# Patient Record
Sex: Female | Born: 1935 | Race: White | Hispanic: No | Smoking: Never smoker
Health system: Southern US, Community
[De-identification: ages and names within clinical notes are randomized; demographics above are authoritative.]

## PROBLEM LIST (undated history)

## (undated) DIAGNOSIS — M1990 Unspecified osteoarthritis, unspecified site: Secondary | ICD-10-CM

## (undated) DIAGNOSIS — G309 Alzheimer's disease, unspecified: Secondary | ICD-10-CM

## (undated) DIAGNOSIS — Z6791 Unspecified blood type, Rh negative: Secondary | ICD-10-CM

## (undated) DIAGNOSIS — I10 Essential (primary) hypertension: Secondary | ICD-10-CM

## (undated) DIAGNOSIS — C649 Malignant neoplasm of unspecified kidney, except renal pelvis: Secondary | ICD-10-CM

## (undated) DIAGNOSIS — R7611 Nonspecific reaction to tuberculin skin test without active tuberculosis: Secondary | ICD-10-CM

## (undated) DIAGNOSIS — F0280 Dementia in other diseases classified elsewhere without behavioral disturbance: Secondary | ICD-10-CM

## (undated) DIAGNOSIS — Z9289 Personal history of other medical treatment: Secondary | ICD-10-CM

## (undated) DIAGNOSIS — T8859XA Other complications of anesthesia, initial encounter: Secondary | ICD-10-CM

## (undated) DIAGNOSIS — E785 Hyperlipidemia, unspecified: Secondary | ICD-10-CM

## (undated) DIAGNOSIS — K859 Acute pancreatitis without necrosis or infection, unspecified: Secondary | ICD-10-CM

## (undated) DIAGNOSIS — T4145XA Adverse effect of unspecified anesthetic, initial encounter: Secondary | ICD-10-CM

## (undated) HISTORY — PX: NEPHRECTOMY: SHX65

## (undated) HISTORY — PX: CATARACT EXTRACTION W/ INTRAOCULAR LENS IMPLANT: SHX1309

## (undated) HISTORY — PX: BREAST LUMPECTOMY: SHX2

## (undated) HISTORY — PX: TONSILLECTOMY: SUR1361

## (undated) HISTORY — PX: JOINT REPLACEMENT: SHX530

## (undated) HISTORY — PX: ABDOMINAL HYSTERECTOMY: SHX81

## (undated) HISTORY — PX: TOTAL HIP ARTHROPLASTY: SHX124

---

## 1959-05-29 HISTORY — PX: THYROID SURGERY: SHX805

## 1998-02-26 ENCOUNTER — Ambulatory Visit (HOSPITAL_COMMUNITY): Admission: RE | Admit: 1998-02-26 | Discharge: 1998-02-26 | Payer: Self-pay | Admitting: Obstetrics and Gynecology

## 1999-04-02 ENCOUNTER — Encounter: Payer: Self-pay | Admitting: Obstetrics and Gynecology

## 1999-04-02 ENCOUNTER — Ambulatory Visit (HOSPITAL_COMMUNITY): Admission: RE | Admit: 1999-04-02 | Discharge: 1999-04-02 | Payer: Self-pay | Admitting: Obstetrics and Gynecology

## 2002-09-27 DIAGNOSIS — Z9289 Personal history of other medical treatment: Secondary | ICD-10-CM

## 2002-09-27 HISTORY — DX: Personal history of other medical treatment: Z92.89

## 2014-07-22 ENCOUNTER — Emergency Department (HOSPITAL_BASED_OUTPATIENT_CLINIC_OR_DEPARTMENT_OTHER): Payer: Medicare Other

## 2014-07-22 ENCOUNTER — Emergency Department (HOSPITAL_BASED_OUTPATIENT_CLINIC_OR_DEPARTMENT_OTHER)
Admission: EM | Admit: 2014-07-22 | Discharge: 2014-07-23 | Disposition: A | Discharge status: Other Acute Inpatient Hospital | Payer: Medicare Other | Attending: Emergency Medicine | Admitting: Emergency Medicine

## 2014-07-22 ENCOUNTER — Encounter (HOSPITAL_BASED_OUTPATIENT_CLINIC_OR_DEPARTMENT_OTHER): Payer: Self-pay | Admitting: Emergency Medicine

## 2014-07-22 DIAGNOSIS — R748 Abnormal levels of other serum enzymes: Secondary | ICD-10-CM

## 2014-07-22 DIAGNOSIS — R531 Weakness: Secondary | ICD-10-CM | POA: Insufficient documentation

## 2014-07-22 DIAGNOSIS — R109 Unspecified abdominal pain: Secondary | ICD-10-CM | POA: Insufficient documentation

## 2014-07-22 DIAGNOSIS — I10 Essential (primary) hypertension: Secondary | ICD-10-CM | POA: Insufficient documentation

## 2014-07-22 DIAGNOSIS — R110 Nausea: Secondary | ICD-10-CM | POA: Diagnosis present

## 2014-07-22 DIAGNOSIS — O26899 Other specified pregnancy related conditions, unspecified trimester: Secondary | ICD-10-CM

## 2014-07-22 HISTORY — DX: Essential (primary) hypertension: I10

## 2014-07-22 HISTORY — DX: Unspecified osteoarthritis, unspecified site: M19.90

## 2014-07-22 LAB — CBC WITH DIFFERENTIAL/PLATELET
BASOS ABS: 0 10*3/uL (ref 0.0–0.1)
BASOS PCT: 1 % (ref 0–1)
EOS ABS: 0.1 10*3/uL (ref 0.0–0.7)
Eosinophils Relative: 1 % (ref 0–5)
HCT: 35.5 % — ABNORMAL LOW (ref 36.0–46.0)
HEMOGLOBIN: 11.7 g/dL — AB (ref 12.0–15.0)
Lymphocytes Relative: 16 % (ref 12–46)
Lymphs Abs: 1.3 10*3/uL (ref 0.7–4.0)
MCH: 32.7 pg (ref 26.0–34.0)
MCHC: 33 g/dL (ref 30.0–36.0)
MCV: 99.2 fL (ref 78.0–100.0)
MONOS PCT: 8 % (ref 3–12)
Monocytes Absolute: 0.6 10*3/uL (ref 0.1–1.0)
NEUTROS ABS: 6.2 10*3/uL (ref 1.7–7.7)
Neutrophils Relative %: 74 % (ref 43–77)
Platelets: 221 10*3/uL (ref 150–400)
RBC: 3.58 MIL/uL — ABNORMAL LOW (ref 3.87–5.11)
RDW: 13.1 % (ref 11.5–15.5)
WBC: 8.2 10*3/uL (ref 4.0–10.5)

## 2014-07-22 LAB — I-STAT CG4 LACTIC ACID, ED: Lactic Acid, Venous: 1.02 mmol/L (ref 0.5–2.2)

## 2014-07-22 LAB — COMPREHENSIVE METABOLIC PANEL
ALBUMIN: 3 g/dL — AB (ref 3.5–5.2)
ALT: 8 U/L (ref 0–35)
ANION GAP: 14 (ref 5–15)
AST: 14 U/L (ref 0–37)
Alkaline Phosphatase: 49 U/L (ref 39–117)
BUN: 30 mg/dL — ABNORMAL HIGH (ref 6–23)
CHLORIDE: 99 meq/L (ref 96–112)
CO2: 25 mEq/L (ref 19–32)
Calcium: 9.2 mg/dL (ref 8.4–10.5)
Creatinine, Ser: 1 mg/dL (ref 0.50–1.10)
GFR calc Af Amer: 58 mL/min — ABNORMAL LOW (ref >90)
GFR calc non Af Amer: 50 mL/min — ABNORMAL LOW (ref >90)
Glucose, Bld: 186 mg/dL — ABNORMAL HIGH (ref 70–99)
Potassium: 3.7 mEq/L (ref 3.7–5.3)
Sodium: 138 mEq/L (ref 137–147)
Total Bilirubin: 0.3 mg/dL (ref 0.3–1.2)
Total Protein: 6.4 g/dL (ref 6.0–8.3)

## 2014-07-22 LAB — TROPONIN I

## 2014-07-22 LAB — URINALYSIS, ROUTINE W REFLEX MICROSCOPIC
Bilirubin Urine: NEGATIVE
Glucose, UA: NEGATIVE mg/dL
Ketones, ur: 15 mg/dL — AB
Nitrite: NEGATIVE
Protein, ur: NEGATIVE mg/dL
SPECIFIC GRAVITY, URINE: 1.016 (ref 1.005–1.030)
UROBILINOGEN UA: 1 mg/dL (ref 0.0–1.0)
pH: 6.5 (ref 5.0–8.0)

## 2014-07-22 LAB — URINE MICROSCOPIC-ADD ON

## 2014-07-22 LAB — LIPASE, BLOOD: Lipase: 513 U/L — ABNORMAL HIGH (ref 11–59)

## 2014-07-22 MED ORDER — IOHEXOL 300 MG/ML  SOLN
25.0000 mL | Freq: Once | INTRAMUSCULAR | Status: AC | PRN
  Administered 2014-07-22: 25 mL via ORAL

## 2014-07-22 MED ORDER — IOHEXOL 300 MG/ML  SOLN
100.0000 mL | Freq: Once | INTRAMUSCULAR | Status: AC | PRN
  Administered 2014-07-22: 80 mL via INTRAVENOUS

## 2014-07-22 NOTE — ED Notes (Signed)
[facility] spoke with  Kaitlin Rodriguez, pt normally ambulates independently with no assitive device, pt mental status is worse at night per nursing home due to sundowners.

## 2014-07-22 NOTE — ED Notes (Signed)
Pt. Is here due to nausea that started after or before dinner tonight.  Pt. Is confused and unable to give full details of what is bothering her to full extent.

## 2014-07-22 NOTE — ED Provider Notes (Signed)
CSN: [id number]     Arrival date & time 07/22/14  1926 History   First MD Initiated Contact with Patient 07/22/14 1932     Chief Complaint  Patient presents with  . Nausea     (Consider location/radiation/quality/duration/timing/severity/associated sxs/prior Treatment) The history is provided by the patient and medical records. No language interpreter was used.    Kaitlin Rodriguez is a 80 y.o. female  with a hx of HTN, dementia, oesteoarthritis presents to the Emergency Department complaining of gradual, persistent, progressively worsening nausea and general weakness onset approx 1 hour PTA.  Pt comes in via EMS who states that she refused nausea medication with them.  RN spoke with facility which reports the patient lives independently and walks without assistance. She is able to perform her own ADLs.  Patient gives vague answers to review of system questions and is unable to clarify her general weakness. She reports she simply feels unwell.  Pt denies specific fever, headache, neck pain, chest pain, shortness of breath, abdominal pain, vomiting, diarrhea, dizziness, syncope, dysuria..     Past Medical History  Diagnosis Date  . Hypertension   . Dementia   . Osteoarthritis    History reviewed. No pertinent past surgical history. No family history on file. History  Substance Use Topics  . Smoking status: Never Smoker   . Smokeless tobacco: Not on file  . Alcohol Use: Not on file   OB History   Grav Para Term Preterm Abortions TAB SAB Ect Mult Living                 Review of Systems  Constitutional: Positive for chills. Negative for fever, diaphoresis, appetite change, fatigue and unexpected weight change.  HENT: Negative for mouth sores.   Eyes: Negative for visual disturbance.  Respiratory: Negative for cough, chest tightness, shortness of breath and wheezing.   Cardiovascular: Negative for chest pain.  Gastrointestinal: Positive for nausea. Negative for vomiting, abdominal  pain, diarrhea and constipation.  Endocrine: Negative for polydipsia, polyphagia and polyuria.  Genitourinary: Negative for dysuria, urgency, frequency and hematuria.  Musculoskeletal: Negative for back pain and neck stiffness.  Skin: Negative for rash.  Allergic/Immunologic: Negative for immunocompromised state.  Neurological: Positive for weakness. Negative for syncope, light-headedness and headaches.  Hematological: Does not bruise/bleed easily.  Psychiatric/Behavioral: Negative for sleep disturbance. The patient is not nervous/anxious.       Allergies  Oxybutynin and Sulfa antibiotics  Home Medications   Prior to Admission medications   Medication Sig Start Date End Date Taking? Authorizing Provider  atorvastatin (LIPITOR) 40 MG tablet Take 40 mg by mouth daily.   Yes Historical Provider, MD  donepezil (ARICEPT) 5 MG tablet Take 5 mg by mouth at bedtime.   Yes Historical Provider, MD  traMADol (ULTRAM) 50 MG tablet Take by mouth every 6 (six) hours as needed.   Yes Historical Provider, MD   BP 104/79  Pulse 80  Temp(Src) 97.5 F (36.4 C) (Rectal)  Resp 22  SpO2 98% Physical Exam  Nursing note and vitals reviewed. Constitutional: She appears well-developed and well-nourished. No distress.  Awake, alert, nontoxic appearance  HENT:  Head: Normocephalic and atraumatic.  Mouth/Throat: Oropharynx is clear and moist. No oropharyngeal exudate.  Eyes: Conjunctivae are normal. No scleral icterus.  Neck: Normal range of motion. Neck supple.  Cardiovascular: Normal rate, regular rhythm and intact distal pulses.   Pulmonary/Chest: Effort normal and breath sounds normal. No respiratory distress. She has no wheezes.  Equal chest expansion  Abdominal: Soft. Bowel sounds are normal. She exhibits no mass. There is no tenderness. There is no rebound and no guarding.  Soft and nontender Midline surgical incision, well-healed  Musculoskeletal: Normal range of motion. She exhibits no  edema.  Neurological: She is alert.  Speech is clear and goal oriented Moves extremities without ataxia  Skin: She is diaphoretic. There is pallor.  cool and slightly clammy  Psychiatric: She has a normal mood and affect.    ED Course  Procedures (including critical care time) Labs Review Labs Reviewed  CBC WITH DIFFERENTIAL - Abnormal; Notable for the following:    RBC 3.58 (*)    Hemoglobin 11.7 (*)    HCT 35.5 (*)    All other components within normal limits  COMPREHENSIVE METABOLIC PANEL - Abnormal; Notable for the following:    Glucose, Bld 186 (*)    BUN 30 (*)    Albumin 3.0 (*)    GFR calc non Af Amer 50 (*)    GFR calc Af Amer 58 (*)    All other components within normal limits  LIPASE, BLOOD - Abnormal; Notable for the following:    Lipase 513 (*)    All other components within normal limits  URINALYSIS, ROUTINE W REFLEX MICROSCOPIC - Abnormal; Notable for the following:    Hgb urine dipstick SMALL (*)    Ketones, ur 15 (*)    Leukocytes, UA SMALL (*)    All other components within normal limits  URINE MICROSCOPIC-ADD ON - Abnormal; Notable for the following:    Squamous Epithelial / LPF FEW (*)    All other components within normal limits  TROPONIN I  I-STAT CG4 LACTIC ACID, ED    Imaging Review Ct Abdomen Pelvis W Contrast  07/22/2014   CLINICAL DATA:  Nausea  EXAM: CT ABDOMEN AND PELVIS WITH CONTRAST  TECHNIQUE: Multidetector CT imaging of the abdomen and pelvis was performed using the standard protocol following bolus administration of intravenous contrast.  CONTRAST:  64mL OMNIPAQUE IOHEXOL 300 MG/ML SOLN, 44mL OMNIPAQUE IOHEXOL 300 MG/ML SOLN  COMPARISON:  11/08/2012  FINDINGS: Lung bases are predominantly clear. Heart size within normal range. Trace pericardial fluid versus thickening.  Mild intra and extrahepatic biliary ductal prominence, appears to taper toward the ampulla. Cholelithiasis. No pericholecystic fluid or fat stranding.  Right hepatic lobe  cysts, unchanged. No appreciable abnormality of the spleen, pancreas, adrenal glands, left kidney. Mild fullness of the left renal collecting system and ureter. The mid to distal left ureter shows urothelial hyperenhancement. The distal ureter is obscured by streak artifact from the patient's hip arthroplasties. Absent right kidney.  Large stool burden. Colonic diverticulosis. No definite evidence for diverticulitis or colitis. Normal appendix. Small bowel loops are of normal course and caliber. No free intraperitoneal air or fluid. No lymphadenopathy. Anterior abdominal wall laxity at the level of the umbilicus with small fat containing hernia.  Advanced atherosclerotic disease of the aorta and branch vessels without aneurysmal dilatation.  The bladder is obscured by streak artifact as are the adnexa. No appreciable abnormality of the uterus.  Multilevel degenerative change.  No acute osseous finding.  IMPRESSION: Mild fullness of the left renal collecting system and ureter however no delayed excretion. Mild urothelial hyperenhancement of the mid-distal left ureter, may reflect infectious or inflammatory change. Unfortunately, the distal left ureter and bladder are obscured by streak artifact from the patient's hip arthroplasties, limiting further evaluation. Correlate with urinalysis and ureteroscopy if warranted.  Mild biliary ductal prominence appears to  smoothly taper to the ampulla. Correlate with LFTs and ERCP if warranted. Cholelithiasis however no CT evidence for cholecystitis.  Colonic diverticulosis.   Electronically Signed   By: Carlos Levering M.D.   On: 07/22/2014 23:13   Dg Chest Port 1 View  07/22/2014   CLINICAL DATA:  Weakness.  Initial encounter  EXAM: PORTABLE CHEST - 1 VIEW  COMPARISON:  05/10/2014  FINDINGS: Normal heart size and mediastinal contours for age. No acute infiltrate or edema. No effusion or pneumothorax. No acute osseous findings.  IMPRESSION: No active disease.    Electronically Signed   By: Jorje Guild M.D.   On: 07/22/2014 21:03     EKG Interpretation   Date/Time:  Monday July 22 2014 19:50:09 EDT Ventricular Rate:  61 PR Interval:  184 QRS Duration: 84 QT Interval:  442 QTC Calculation: 444 R Axis:   31 Text Interpretation:  Sinus rhythm with marked sinus arrhythmia Otherwise  normal ECG Confirmed by HARRISON  MD, FORREST ([id number]) on 07/22/2014  11:45:02 PM       MDM   Final diagnoses:  Weakness  Abdominal pain complicating pregnancy  Elevated lipase  Abdominal pain complicating pregnancy  Elevated lipase   Kaitlin Rodriguez presents with nausea, diaphoresis and pallor. Patient reports "feeling ill."  Will begin workup including chest x-ray, UA,, , troponin and abdominal labs. EKG reassuring.  9:25 PM  Lipase at 513, otherwise reassuring including liver enzymes. Will obtain CT scan.   Diaphoresis has resolved.  11:30PM CT scan with mild biliary ductal prominence, old lithiasis but no CT evidence of cholecystitis. Patient remains with a negative Murphy sign and no abdominal distention, rebound or tenderness.  Admit for further monitoring and by mouth trials.   11:41 PM Discussed with Dr. Florene Glen at [facility] who will admit for further evaluation and monitoring.    BP 104/79  Pulse 80  Temp(Src) 97.5 F (36.4 C) (Rectal)  Resp 22  SpO2 98%   The patient was discussed with and seen by Dr. Aline Brochure who agrees with the treatment plan.   Jarrett Soho Perel Hauschild, PA-C 07/22/14 2345

## 2014-07-23 DIAGNOSIS — I10 Essential (primary) hypertension: Secondary | ICD-10-CM | POA: Diagnosis not present

## 2014-07-23 DIAGNOSIS — R531 Weakness: Secondary | ICD-10-CM | POA: Diagnosis not present

## 2014-07-23 DIAGNOSIS — R110 Nausea: Secondary | ICD-10-CM | POA: Diagnosis present

## 2014-07-23 NOTE — ED Provider Notes (Signed)
Medical screening examination/treatment/procedure(s) were conducted as a shared visit with non-physician practitioner(s) and myself.  I personally evaluated the patient during the encounter.   EKG Interpretation   Date/Time:  Monday July 22 2014 19:50:09 EDT Ventricular Rate:  61 PR Interval:  184 QRS Duration: 84 QT Interval:  442 QTC Calculation: 444 R Axis:   31 Text Interpretation:  Sinus rhythm with marked sinus arrhythmia Otherwise  normal ECG Confirmed by Ramia Sidney  MD, Haidy Kackley ([id number]) on 07/22/2014  11:45:02 PM      I interviewed and examined the patient. Lungs are CTAB. Cardiac exam wnl. Abdomen soft.  Elevated lipase. Plan for admission to hospitalist.   Pamella Pert, MD 07/23/14 [id number]

## 2014-08-27 ENCOUNTER — Emergency Department (HOSPITAL_BASED_OUTPATIENT_CLINIC_OR_DEPARTMENT_OTHER): Payer: Medicare Other

## 2014-08-27 ENCOUNTER — Emergency Department (HOSPITAL_BASED_OUTPATIENT_CLINIC_OR_DEPARTMENT_OTHER)
Admission: EM | Admit: 2014-08-27 | Discharge: 2014-08-27 | Disposition: A | Discharge status: Home / Self Care | Payer: Medicare Other | Attending: Emergency Medicine | Admitting: Emergency Medicine

## 2014-08-27 ENCOUNTER — Encounter (HOSPITAL_BASED_OUTPATIENT_CLINIC_OR_DEPARTMENT_OTHER): Payer: Self-pay | Admitting: *Deleted

## 2014-08-27 DIAGNOSIS — M1990 Unspecified osteoarthritis, unspecified site: Secondary | ICD-10-CM | POA: Diagnosis not present

## 2014-08-27 DIAGNOSIS — W1839XA Other fall on same level, initial encounter: Secondary | ICD-10-CM | POA: Insufficient documentation

## 2014-08-27 DIAGNOSIS — Z79899 Other long term (current) drug therapy: Secondary | ICD-10-CM | POA: Diagnosis not present

## 2014-08-27 DIAGNOSIS — Y9289 Other specified places as the place of occurrence of the external cause: Secondary | ICD-10-CM | POA: Diagnosis not present

## 2014-08-27 DIAGNOSIS — Y998 Other external cause status: Secondary | ICD-10-CM | POA: Diagnosis not present

## 2014-08-27 DIAGNOSIS — W19XXXA Unspecified fall, initial encounter: Secondary | ICD-10-CM

## 2014-08-27 DIAGNOSIS — F0390 Unspecified dementia without behavioral disturbance: Secondary | ICD-10-CM | POA: Insufficient documentation

## 2014-08-27 DIAGNOSIS — Z043 Encounter for examination and observation following other accident: Secondary | ICD-10-CM | POA: Diagnosis present

## 2014-08-27 DIAGNOSIS — Y9389 Activity, other specified: Secondary | ICD-10-CM | POA: Diagnosis not present

## 2014-08-27 DIAGNOSIS — Z792 Long term (current) use of antibiotics: Secondary | ICD-10-CM | POA: Diagnosis not present

## 2014-08-27 DIAGNOSIS — I10 Essential (primary) hypertension: Secondary | ICD-10-CM | POA: Diagnosis not present

## 2014-08-27 LAB — URINALYSIS, ROUTINE W REFLEX MICROSCOPIC
BILIRUBIN URINE: NEGATIVE
Glucose, UA: NEGATIVE mg/dL
Ketones, ur: NEGATIVE mg/dL
Nitrite: NEGATIVE
PH: 6 (ref 5.0–8.0)
Protein, ur: 30 mg/dL — AB
SPECIFIC GRAVITY, URINE: 1.025 (ref 1.005–1.030)
Urobilinogen, UA: 1 mg/dL (ref 0.0–1.0)

## 2014-08-27 LAB — CBC WITH DIFFERENTIAL/PLATELET
Basophils Absolute: 0.1 10*3/uL (ref 0.0–0.1)
Basophils Relative: 1 % (ref 0–1)
EOS PCT: 2 % (ref 0–5)
Eosinophils Absolute: 0.2 10*3/uL (ref 0.0–0.7)
HEMATOCRIT: 36.2 % (ref 36.0–46.0)
HEMOGLOBIN: 12 g/dL (ref 12.0–15.0)
Lymphocytes Relative: 38 % (ref 12–46)
Lymphs Abs: 3.1 10*3/uL (ref 0.7–4.0)
MCH: 32.8 pg (ref 26.0–34.0)
MCHC: 33.1 g/dL (ref 30.0–36.0)
MCV: 98.9 fL (ref 78.0–100.0)
MONOS PCT: 9 % (ref 3–12)
Monocytes Absolute: 0.8 10*3/uL (ref 0.1–1.0)
NEUTROS ABS: 4.2 10*3/uL (ref 1.7–7.7)
Neutrophils Relative %: 50 % (ref 43–77)
Platelets: 406 10*3/uL — ABNORMAL HIGH (ref 150–400)
RBC: 3.66 MIL/uL — ABNORMAL LOW (ref 3.87–5.11)
RDW: 13.4 % (ref 11.5–15.5)
WBC: 8.3 10*3/uL (ref 4.0–10.5)

## 2014-08-27 LAB — URINE MICROSCOPIC-ADD ON

## 2014-08-27 LAB — BASIC METABOLIC PANEL
Anion gap: 16 — ABNORMAL HIGH (ref 5–15)
BUN: 32 mg/dL — AB (ref 6–23)
CALCIUM: 9.5 mg/dL (ref 8.4–10.5)
CHLORIDE: 101 meq/L (ref 96–112)
CO2: 24 meq/L (ref 19–32)
Creatinine, Ser: 1 mg/dL (ref 0.50–1.10)
GFR calc Af Amer: 58 mL/min — ABNORMAL LOW (ref >90)
GFR calc non Af Amer: 50 mL/min — ABNORMAL LOW (ref >90)
Glucose, Bld: 130 mg/dL — ABNORMAL HIGH (ref 70–99)
Potassium: 4.3 mEq/L (ref 3.7–5.3)
Sodium: 141 mEq/L (ref 137–147)

## 2014-08-27 MED ORDER — CEFPODOXIME PROXETIL 100 MG PO TABS: 100.0000 mg | ORAL_TABLET | Freq: Two times a day (BID) | ORAL | Status: DC

## 2014-08-27 NOTE — Discharge Instructions (Signed)
Fall Prevention and Home Safety  Falls cause injuries and can affect all age groups. It is possible to use preventive measures to significantly decrease the likelihood of falls. There are many simple measures which can make your home safer and prevent falls.  OUTDOORS   Repair cracks and edges of walkways and driveways.   Remove high doorway thresholds.   Trim shrubbery on the main path into your home.   Have good outside lighting.   Clear walkways of tools, rocks, debris, and clutter.   Check that handrails are not broken and are securely fastened. Both sides of steps should have handrails.   Have leaves, snow, and ice cleared regularly.   Use sand or salt on walkways during winter months.   In the garage, clean up grease or oil spills.  BATHROOM   Install night lights.   Install grab bars by the toilet and in the tub and shower.   Use non-skid mats or decals in the tub or shower.   Place a plastic non-slip stool in the shower to sit on, if needed.   Keep floors dry and clean up all water on the floor immediately.   Remove soap buildup in the tub or shower on a regular basis.   Secure bath mats with non-slip, double-sided rug tape.   Remove throw rugs and tripping hazards from the floors.  BEDROOMS   Install night lights.   Make sure a bedside light is easy to reach.   Do not use oversized bedding.   Keep a telephone by your bedside.   Have a firm chair with side arms to use for getting dressed.   Remove throw rugs and tripping hazards from the floor.  KITCHEN   Keep handles on pots and pans turned toward the center of the stove. Use back burners when possible.   Clean up spills quickly and allow time for drying.   Avoid walking on wet floors.   Avoid hot utensils and knives.   Position shelves so they are not too high or low.   Place commonly used objects within easy reach.   If necessary, use a sturdy step stool with a grab bar when reaching.   Keep electrical cables out of the  way.   Do not use floor polish or wax that makes floors slippery. If you must use wax, use non-skid floor wax.   Remove throw rugs and tripping hazards from the floor.  STAIRWAYS   Never leave objects on stairs.   Place handrails on both sides of stairways and use them. Fix any loose handrails. Make sure handrails on both sides of the stairways are as long as the stairs.   Check carpeting to make sure it is firmly attached along stairs. Make repairs to worn or loose carpet promptly.   Avoid placing throw rugs at the top or bottom of stairways, or properly secure the rug with carpet tape to prevent slippage. Get rid of throw rugs, if possible.   Have an electrician put in a light switch at the top and bottom of the stairs.  OTHER FALL PREVENTION TIPS   Wear low-heel or rubber-soled shoes that are supportive and fit well. Wear closed toe shoes.   When using a stepladder, make sure it is fully opened and both spreaders are firmly locked. Do not climb a closed stepladder.   Add color or contrast paint or tape to grab bars and handrails in your home. Place contrasting color strips on first and last   steps.   Learn and use mobility aids as needed. Install an electrical emergency response system.   Turn on lights to avoid dark areas. Replace light bulbs that burn out immediately. Get light switches that glow.   Arrange furniture to create clear pathways. Keep furniture in the same place.   Firmly attach carpet with non-skid or double-sided tape.   Eliminate uneven floor surfaces.   Select a carpet pattern that does not visually hide the edge of steps.   Be aware of all pets.  OTHER HOME SAFETY TIPS   Set the water temperature for 120 F (48.8 C).   Keep emergency numbers on or near the telephone.   Keep smoke detectors on every level of the home and near sleeping areas.  Document Released: 09/03/2002 Document Revised: 03/14/2012 Document Reviewed: 12/03/2011  Exit[employer] Patient Information 2015  [employer]. This information is not intended to replace advice given to you by your health care provider. Make sure you discuss any questions you have with your health care provider.    Urinary Tract Infection  Urinary tract infections (UTIs) can develop anywhere along your urinary tract. Your urinary tract is your body's drainage system for removing wastes and extra water. Your urinary tract includes two kidneys, two ureters, a bladder, and a urethra. Your kidneys are a pair of bean-shaped organs. Each kidney is about the size of your fist. They are located below your ribs, one on each side of your spine.  CAUSES  Infections are caused by microbes, which are microscopic organisms, including fungi, viruses, and bacteria. These organisms are so small that they can only be seen through a microscope. Bacteria are the microbes that most commonly cause UTIs.  SYMPTOMS   Symptoms of UTIs may vary by age and gender of the patient and by the location of the infection. Symptoms in young women typically include a frequent and intense urge to urinate and a painful, burning feeling in the bladder or urethra during urination. Older women and men are more likely to be tired, shaky, and weak and have muscle aches and abdominal pain. A fever may mean the infection is in your kidneys. Other symptoms of a kidney infection include pain in your back or sides below the ribs, nausea, and vomiting.  DIAGNOSIS  To diagnose a UTI, your caregiver will ask you about your symptoms. Your caregiver also will ask to provide a urine sample. The urine sample will be tested for bacteria and white blood cells. White blood cells are made by your body to help fight infection.  TREATMENT   Typically, UTIs can be treated with medication. Because most UTIs are caused by a bacterial infection, they usually can be treated with the use of antibiotics. The choice of antibiotic and length of treatment depend on your symptoms and the type of bacteria causing  your infection.  HOME CARE INSTRUCTIONS   If you were prescribed antibiotics, take them exactly as your caregiver instructs you. Finish the medication even if you feel better after you have only taken some of the medication.   Drink enough water and fluids to keep your urine clear or pale yellow.   Avoid caffeine, tea, and carbonated beverages. They tend to irritate your bladder.   Empty your bladder often. Avoid holding urine for long periods of time.   Empty your bladder before and after sexual intercourse.   After a bowel movement, women should cleanse from front to back. Use each tissue only once.  SEEK MEDICAL CARE   IF:    You have back pain.   You develop a fever.   Your symptoms do not begin to resolve within 3 days.  SEEK IMMEDIATE MEDICAL CARE IF:    You have severe back pain or lower abdominal pain.   You develop chills.   You have nausea or vomiting.   You have continued burning or discomfort with urination.  MAKE SURE YOU:    Understand these instructions.   Will watch your condition.   Will get help right away if you are not doing well or get worse.  Document Released: 06/23/2005 Document Revised: 03/14/2012 Document Reviewed: 10/22/2011  [employer] Patient Information 2015 [employer]. This information is not intended to replace advice given to you by your health care provider. Make sure you discuss any questions you have with your health care provider.

## 2014-08-27 NOTE — ED Notes (Signed)
Bright in by EMS for eval from Fall  No complaints

## 2014-08-27 NOTE — ED Notes (Signed)
[facility] called for transport to [facility]

## 2014-08-27 NOTE — ED Provider Notes (Signed)
CSN: [id number]     Arrival date & time 08/27/14  1855 History   First MD Initiated Contact with Patient 08/27/14 1905     Chief Complaint  Patient presents with  . Fall     (Consider location/radiation/quality/duration/timing/severity/associated sxs/prior Treatment) Patient is a 80 y.o. female presenting with fall.  Fall This is a recurrent problem. The current episode started 1 to 2 hours ago. The problem occurs constantly. The problem has not changed since onset.Pertinent negatives include no chest pain, no abdominal pain, no headaches and no shortness of breath. Nothing aggravates the symptoms. Nothing relieves the symptoms.    Past Medical History  Diagnosis Date  . Hypertension   . Dementia   . Osteoarthritis    History reviewed. No pertinent past surgical history. History reviewed. No pertinent family history. History  Substance Use Topics  . Smoking status: Never Smoker   . Smokeless tobacco: Not on file  . Alcohol Use: No   OB History    No data available     Review of Systems  Constitutional: Negative for fever and chills.  HENT: Negative for congestion, rhinorrhea and sore throat.   Eyes: Negative for photophobia and visual disturbance.  Respiratory: Negative for cough and shortness of breath.   Cardiovascular: Negative for chest pain and leg swelling.  Gastrointestinal: Negative for nausea, vomiting, abdominal pain, diarrhea and constipation.  Endocrine: Negative for polyphagia and polyuria.  Genitourinary: Negative for dysuria, flank pain, vaginal bleeding, vaginal discharge and enuresis.  Musculoskeletal: Negative for back pain and gait problem.  Skin: Negative for color change and rash.  Neurological: Negative for dizziness, syncope, light-headedness, numbness and headaches.  Hematological: Negative for adenopathy. Does not bruise/bleed easily.  All other systems reviewed and are negative.     Allergies  Oxybutynin and Sulfa antibiotics  Home  Medications   Prior to Admission medications   Medication Sig Start Date End Date Taking? Authorizing Provider  atorvastatin (LIPITOR) 40 MG tablet Take 40 mg by mouth daily.    Historical Provider, MD  cefpodoxime (VANTIN) 100 MG tablet Take 1 tablet (100 mg total) by mouth 2 (two) times daily. 08/27/14   Debby Freiberg, MD  donepezil (ARICEPT) 5 MG tablet Take 5 mg by mouth at bedtime.    Historical Provider, MD  traMADol (ULTRAM) 50 MG tablet Take by mouth every 6 (six) hours as needed.    Historical Provider, MD   BP 100/60 mmHg  Pulse 72  Temp(Src) 98.7 F (37.1 C) (Oral)  Resp 18  SpO2 98% Physical Exam  Constitutional: She is oriented to person, place, and time. She appears well-developed and well-nourished.  HENT:  Head: Normocephalic and atraumatic.  Right Ear: External ear normal.  Left Ear: External ear normal.  Eyes: Conjunctivae and EOM are normal. Pupils are equal, round, and reactive to light.  Neck: Normal range of motion. Neck supple.  Cardiovascular: Normal rate, regular rhythm, normal heart sounds and intact distal pulses.   Pulmonary/Chest: Effort normal and breath sounds normal.  Abdominal: Soft. Bowel sounds are normal. There is no tenderness.  Musculoskeletal: Normal range of motion.  Neurological: She is alert and oriented to person, place, and time.  Skin: Skin is warm and dry.  Vitals reviewed.   ED Course  Procedures (including critical care time) Labs Review Labs Reviewed  CBC WITH DIFFERENTIAL - Abnormal; Notable for the following:    RBC 3.66 (*)    Platelets 406 (*)    All other components within normal limits  BASIC METABOLIC PANEL - Abnormal; Notable for the following:    Glucose, Bld 130 (*)    BUN 32 (*)    GFR calc non Af Amer 50 (*)    GFR calc Af Amer 58 (*)    Anion gap 16 (*)    All other components within normal limits  URINALYSIS, ROUTINE W REFLEX MICROSCOPIC - Abnormal; Notable for the following:    APPearance CLOUDY (*)     Hgb urine dipstick MODERATE (*)    Protein, ur 30 (*)    Leukocytes, UA MODERATE (*)    All other components within normal limits  URINE MICROSCOPIC-ADD ON - Abnormal; Notable for the following:    Bacteria, UA MANY (*)    Crystals CA OXALATE CRYSTALS (*)    All other components within normal limits    Imaging Review Dg Chest 2 View  08/27/2014   CLINICAL DATA:  80 year old female with dementia status post fall  EXAM: CHEST  2 VIEW  COMPARISON:  Prior chest x-ray 07/22/2014  FINDINGS: Cardiac and mediastinal contours remain within normal limits. Atherosclerotic calcifications present in the transverse aorta. No pneumothorax or pleural effusion. Stable bronchitic changes. No evidence of edema or focal airspace consolidation. The No acute osseous abnormality.  IMPRESSION: No active cardiopulmonary disease.   Electronically Signed   By: Jacqulynn Cadet M.D.   On: 08/27/2014 19:45   Dg Shoulder Right  08/27/2014   CLINICAL DATA:  80 year old female with fall, right shoulder injury and pain. Initial encounter.  EXAM: RIGHT SHOULDER - 2+ VIEW  COMPARISON:  Prior chest radiographs  FINDINGS: No acute fracture, subluxation or dislocation identified.  The visualized left bony thorax is unremarkable.  No focal bony lesions are present.  IMPRESSION: No acute abnormality.   Electronically Signed   By: Hassan Rowan M.D.   On: 08/27/2014 20:11   Ct Head Wo Contrast  08/27/2014   CLINICAL DATA:  Fall.  Dementia.  EXAM: CT HEAD WITHOUT CONTRAST  TECHNIQUE: Contiguous axial images were obtained from the base of the skull through the vertex without intravenous contrast.  COMPARISON:  04/14/2006.  FINDINGS: No mass lesion, mass effect, midline shift, hydrocephalus, hemorrhage. No acute territorial cortical ischemia/infarct. Atrophy and chronic ischemic white matter disease is present. Motion artifact was present on the initial scan. The slices were repeated with improvement and the study is diagnostic. Small lacunar  infarct is present in the LEFT cerebellum which is chronic.  IMPRESSION: Atrophy and chronic ischemic white matter disease without acute intracranial abnormality. Old cerebellar infarcts.   Electronically Signed   By: Dereck Ligas M.D.   On: 08/27/2014 19:58     EKG Interpretation   Date/Time:  Tuesday August 27 2014 19:12:59 EST Ventricular Rate:  60 PR Interval:  182 QRS Duration: 82 QT Interval:  420 QTC Calculation: 420 R Axis:   46 Text Interpretation:  Normal sinus rhythm Normal ECG No significant change  since last tracing Confirmed by Debby Freiberg [phone]) on 08/27/2014  7:41:36 PM      MDM   Final diagnoses:  Fall    80 y.o. female with pertinent PMH of dementia presents from nursing facility after a mechanical fall. Patient is a poor historian. No reported chest pain, syncope. The patient was getting out of her bed, slipped, and was able to lower herself to the ground. She states that she did not hit her head. On arrival to the patient has vital signs and physical exam as above. the trauma. she  has a very mild amount of tenderness in the right shoulder. images obtained as above and unremarkable for acute pathology. she also signs of urinary tract infection. discharged home in stable condition.    1. Massie Kluver, MD 08/27/14 2113

## 2014-08-27 NOTE — ED Notes (Signed)
MD at bedside. 

## 2014-12-04 ENCOUNTER — Inpatient Hospital Stay (HOSPITAL_BASED_OUTPATIENT_CLINIC_OR_DEPARTMENT_OTHER)
Admission: EM | Admit: 2014-12-04 | Discharge: 2014-12-08 | DRG: 536 | Disposition: A | Discharge status: Skilled Nursing Facility | Payer: Medicare Other | Attending: Internal Medicine | Admitting: Internal Medicine

## 2014-12-04 ENCOUNTER — Other Ambulatory Visit (HOSPITAL_BASED_OUTPATIENT_CLINIC_OR_DEPARTMENT_OTHER): Payer: Self-pay

## 2014-12-04 ENCOUNTER — Encounter (HOSPITAL_BASED_OUTPATIENT_CLINIC_OR_DEPARTMENT_OTHER): Payer: Self-pay | Admitting: *Deleted

## 2014-12-04 ENCOUNTER — Emergency Department (HOSPITAL_BASED_OUTPATIENT_CLINIC_OR_DEPARTMENT_OTHER): Payer: Medicare Other

## 2014-12-04 DIAGNOSIS — G309 Alzheimer's disease, unspecified: Secondary | ICD-10-CM

## 2014-12-04 DIAGNOSIS — Z961 Presence of intraocular lens: Secondary | ICD-10-CM | POA: Diagnosis present

## 2014-12-04 DIAGNOSIS — S72111A Displaced fracture of greater trochanter of right femur, initial encounter for closed fracture: Secondary | ICD-10-CM | POA: Diagnosis present

## 2014-12-04 DIAGNOSIS — N390 Urinary tract infection, site not specified: Secondary | ICD-10-CM

## 2014-12-04 DIAGNOSIS — I10 Essential (primary) hypertension: Secondary | ICD-10-CM | POA: Diagnosis present

## 2014-12-04 DIAGNOSIS — X58XXXA Exposure to other specified factors, initial encounter: Secondary | ICD-10-CM | POA: Diagnosis present

## 2014-12-04 DIAGNOSIS — Z96643 Presence of artificial hip joint, bilateral: Secondary | ICD-10-CM | POA: Diagnosis present

## 2014-12-04 DIAGNOSIS — M25551 Pain in right hip: Secondary | ICD-10-CM

## 2014-12-04 DIAGNOSIS — Z66 Do not resuscitate: Secondary | ICD-10-CM | POA: Diagnosis present

## 2014-12-04 DIAGNOSIS — R319 Hematuria, unspecified: Secondary | ICD-10-CM | POA: Diagnosis present

## 2014-12-04 DIAGNOSIS — Z85528 Personal history of other malignant neoplasm of kidney: Secondary | ICD-10-CM

## 2014-12-04 DIAGNOSIS — F0280 Dementia in other diseases classified elsewhere without behavioral disturbance: Secondary | ICD-10-CM | POA: Diagnosis present

## 2014-12-04 DIAGNOSIS — E860 Dehydration: Secondary | ICD-10-CM

## 2014-12-04 DIAGNOSIS — Y92129 Unspecified place in nursing home as the place of occurrence of the external cause: Secondary | ICD-10-CM | POA: Diagnosis not present

## 2014-12-04 DIAGNOSIS — S72001D Fracture of unspecified part of neck of right femur, subsequent encounter for closed fracture with routine healing: Secondary | ICD-10-CM | POA: Diagnosis not present

## 2014-12-04 DIAGNOSIS — M75101 Unspecified rotator cuff tear or rupture of right shoulder, not specified as traumatic: Secondary | ICD-10-CM | POA: Diagnosis present

## 2014-12-04 DIAGNOSIS — Z79899 Other long term (current) drug therapy: Secondary | ICD-10-CM | POA: Diagnosis not present

## 2014-12-04 DIAGNOSIS — S72001A Fracture of unspecified part of neck of right femur, initial encounter for closed fracture: Secondary | ICD-10-CM | POA: Diagnosis not present

## 2014-12-04 DIAGNOSIS — M1990 Unspecified osteoarthritis, unspecified site: Secondary | ICD-10-CM | POA: Diagnosis present

## 2014-12-04 DIAGNOSIS — E785 Hyperlipidemia, unspecified: Secondary | ICD-10-CM | POA: Diagnosis present

## 2014-12-04 DIAGNOSIS — Z9849 Cataract extraction status, unspecified eye: Secondary | ICD-10-CM

## 2014-12-04 DIAGNOSIS — R52 Pain, unspecified: Secondary | ICD-10-CM

## 2014-12-04 HISTORY — DX: Acute pancreatitis without necrosis or infection, unspecified: K85.90

## 2014-12-04 HISTORY — DX: Other complications of anesthesia, initial encounter: T88.59XA

## 2014-12-04 HISTORY — DX: Nonspecific reaction to tuberculin skin test without active tuberculosis: R76.11

## 2014-12-04 HISTORY — DX: Unspecified blood type, rh negative: Z67.91

## 2014-12-04 HISTORY — DX: Dementia in other diseases classified elsewhere, unspecified severity, without behavioral disturbance, psychotic disturbance, mood disturbance, and anxiety: F02.80

## 2014-12-04 HISTORY — DX: Malignant neoplasm of unspecified kidney, except renal pelvis: C64.9

## 2014-12-04 HISTORY — DX: Adverse effect of unspecified anesthetic, initial encounter: T41.45XA

## 2014-12-04 HISTORY — DX: Personal history of other medical treatment: Z92.89

## 2014-12-04 HISTORY — DX: Alzheimer's disease, unspecified: G30.9

## 2014-12-04 HISTORY — DX: Hyperlipidemia, unspecified: E78.5

## 2014-12-04 LAB — COMPREHENSIVE METABOLIC PANEL
ALT: 12 U/L (ref 0–35)
ANION GAP: 2 — AB (ref 5–15)
AST: 25 U/L (ref 0–37)
Albumin: 3.2 g/dL — ABNORMAL LOW (ref 3.5–5.2)
Alkaline Phosphatase: 46 U/L (ref 39–117)
BILIRUBIN TOTAL: 0.5 mg/dL (ref 0.3–1.2)
BUN: 35 mg/dL — AB (ref 6–23)
CALCIUM: 8.5 mg/dL (ref 8.4–10.5)
CHLORIDE: 109 mmol/L (ref 96–112)
CO2: 24 mmol/L (ref 19–32)
CREATININE: 1.11 mg/dL — AB (ref 0.50–1.10)
GFR, EST AFRICAN AMERICAN: 51 mL/min — AB (ref >90)
GFR, EST NON AFRICAN AMERICAN: 44 mL/min — AB (ref >90)
Glucose, Bld: 116 mg/dL — ABNORMAL HIGH (ref 70–99)
POTASSIUM: 3.5 mmol/L (ref 3.5–5.1)
Sodium: 135 mmol/L (ref 135–145)
TOTAL PROTEIN: 6.6 g/dL (ref 6.0–8.3)

## 2014-12-04 LAB — CBC
HCT: 35.7 % — ABNORMAL LOW (ref 36.0–46.0)
Hemoglobin: 11.8 g/dL — ABNORMAL LOW (ref 12.0–15.0)
MCH: 30.9 pg (ref 26.0–34.0)
MCHC: 33.1 g/dL (ref 30.0–36.0)
MCV: 93.5 fL (ref 78.0–100.0)
PLATELETS: 357 10*3/uL (ref 150–400)
RBC: 3.82 MIL/uL — AB (ref 3.87–5.11)
RDW: 14.8 % (ref 11.5–15.5)
WBC: 10.9 10*3/uL — ABNORMAL HIGH (ref 4.0–10.5)

## 2014-12-04 LAB — URINALYSIS, ROUTINE W REFLEX MICROSCOPIC
Glucose, UA: NEGATIVE mg/dL
Ketones, ur: 15 mg/dL — AB
NITRITE: NEGATIVE
Protein, ur: 100 mg/dL — AB
Specific Gravity, Urine: 1.027 (ref 1.005–1.030)
Urobilinogen, UA: 1 mg/dL (ref 0.0–1.0)
pH: 8 (ref 5.0–8.0)

## 2014-12-04 LAB — CBC WITH DIFFERENTIAL/PLATELET
BASOS ABS: 0.1 10*3/uL (ref 0.0–0.1)
Basophils Relative: 1 % (ref 0–1)
Eosinophils Absolute: 0 10*3/uL (ref 0.0–0.7)
Eosinophils Relative: 0 % (ref 0–5)
HEMATOCRIT: 34.9 % — AB (ref 36.0–46.0)
Hemoglobin: 11.7 g/dL — ABNORMAL LOW (ref 12.0–15.0)
LYMPHS ABS: 1.4 10*3/uL (ref 0.7–4.0)
LYMPHS PCT: 13 % (ref 12–46)
MCH: 31.7 pg (ref 26.0–34.0)
MCHC: 33.5 g/dL (ref 30.0–36.0)
MCV: 94.6 fL (ref 78.0–100.0)
Monocytes Absolute: 0.7 10*3/uL (ref 0.1–1.0)
Monocytes Relative: 7 % (ref 3–12)
NEUTROS ABS: 8.5 10*3/uL — AB (ref 1.7–7.7)
NEUTROS PCT: 79 % — AB (ref 43–77)
PLATELETS: 353 10*3/uL (ref 150–400)
RBC: 3.69 MIL/uL — AB (ref 3.87–5.11)
RDW: 14.3 % (ref 11.5–15.5)
WBC: 10.6 10*3/uL — AB (ref 4.0–10.5)

## 2014-12-04 LAB — URINE MICROSCOPIC-ADD ON

## 2014-12-04 LAB — PROTIME-INR
INR: 1.08 (ref 0.00–1.49)
PROTHROMBIN TIME: 14 s (ref 11.6–15.2)

## 2014-12-04 LAB — TSH: TSH: 2.611 u[IU]/mL (ref 0.350–4.500)

## 2014-12-04 LAB — TROPONIN I: Troponin I: <0.03 ng/mL (ref <0.031)

## 2014-12-04 LAB — CREATININE, SERUM
Creatinine, Ser: 1.3 mg/dL — ABNORMAL HIGH (ref 0.50–1.10)
GFR calc Af Amer: 42 mL/min — ABNORMAL LOW (ref >90)
GFR, EST NON AFRICAN AMERICAN: 36 mL/min — AB (ref >90)

## 2014-12-04 MED ORDER — FENTANYL CITRATE 0.05 MG/ML IJ SOLN
50.0000 ug | Freq: Once | INTRAMUSCULAR | Status: AC
  Administered 2014-12-04: 50 ug via INTRAVENOUS
  Filled 2014-12-04: qty 2

## 2014-12-04 MED ORDER — DONEPEZIL HCL 5 MG PO TABS
5.0000 mg | ORAL_TABLET | Freq: Every day | ORAL | Status: DC
  Administered 2014-12-04 – 2014-12-07 (×4): 5 mg via ORAL
  Filled 2014-12-04 (×4): qty 1

## 2014-12-04 MED ORDER — ONDANSETRON HCL 4 MG/2ML IJ SOLN: 4.0000 mg | Freq: Four times a day (QID) | INTRAMUSCULAR | Status: DC | PRN

## 2014-12-04 MED ORDER — ATORVASTATIN CALCIUM 40 MG PO TABS
40.0000 mg | ORAL_TABLET | Freq: Every day | ORAL | Status: DC
  Administered 2014-12-05 – 2014-12-08 (×4): 40 mg via ORAL
  Filled 2014-12-04 (×4): qty 1

## 2014-12-04 MED ORDER — CEFTRIAXONE SODIUM IN DEXTROSE 20 MG/ML IV SOLN
1.0000 g | INTRAVENOUS | Status: DC
  Administered 2014-12-05 – 2014-12-07 (×3): 1 g via INTRAVENOUS
  Filled 2014-12-04 (×3): qty 50

## 2014-12-04 MED ORDER — TRAMADOL HCL 50 MG PO TABS
50.0000 mg | ORAL_TABLET | Freq: Once | ORAL | Status: AC
Start: 2014-12-04 — End: 2014-12-04
  Administered 2014-12-04: 50 mg via ORAL
  Filled 2014-12-04: qty 1

## 2014-12-04 MED ORDER — FENTANYL CITRATE 0.05 MG/ML IJ SOLN
12.5000 ug | INTRAMUSCULAR | Status: DC | PRN
  Administered 2014-12-04 (×2): 12.5 ug via INTRAVENOUS
  Filled 2014-12-04 (×2): qty 2

## 2014-12-04 MED ORDER — CEFTRIAXONE SODIUM 1 G IJ SOLR
INTRAMUSCULAR | Status: AC
  Filled 2014-12-04: qty 10

## 2014-12-04 MED ORDER — ACETAMINOPHEN 325 MG PO TABS: 650.0000 mg | ORAL_TABLET | ORAL | Status: DC | PRN

## 2014-12-04 MED ORDER — HALOPERIDOL 1 MG PO TABS
1.0000 mg | ORAL_TABLET | Freq: Four times a day (QID) | ORAL | Status: DC | PRN
Start: 2014-12-04 — End: 2014-12-08
  Filled 2014-12-04: qty 1

## 2014-12-04 MED ORDER — ALUM & MAG HYDROXIDE-SIMETH 200-200-20 MG/5ML PO SUSP: 30.0000 mL | Freq: Four times a day (QID) | ORAL | Status: DC | PRN

## 2014-12-04 MED ORDER — DEXTROSE 5 % IV SOLN
1.0000 g | Freq: Once | INTRAVENOUS | Status: AC
  Administered 2014-12-04: 1 g via INTRAVENOUS

## 2014-12-04 MED ORDER — POLYETHYLENE GLYCOL 3350 17 G PO PACK
17.0000 g | PACK | Freq: Every day | ORAL | Status: DC
  Administered 2014-12-05 – 2014-12-07 (×3): 17 g via ORAL
  Filled 2014-12-04 (×4): qty 1

## 2014-12-04 MED ORDER — HYDROCODONE-ACETAMINOPHEN 5-325 MG PO TABS
1.0000 | ORAL_TABLET | ORAL | Status: DC | PRN
  Administered 2014-12-04: 2 via ORAL
  Administered 2014-12-05 (×3): 1 via ORAL
  Administered 2014-12-05: 2 via ORAL
  Administered 2014-12-05: 1 via ORAL
  Administered 2014-12-06 – 2014-12-08 (×12): 2 via ORAL
  Filled 2014-12-04 (×4): qty 2
  Filled 2014-12-04: qty 1
  Filled 2014-12-04 (×5): qty 2
  Filled 2014-12-04 (×2): qty 1
  Filled 2014-12-04 (×4): qty 2
  Filled 2014-12-04: qty 1
  Filled 2014-12-04: qty 2

## 2014-12-04 MED ORDER — ONDANSETRON HCL 4 MG PO TABS: 4.0000 mg | ORAL_TABLET | Freq: Four times a day (QID) | ORAL | Status: DC | PRN

## 2014-12-04 MED ORDER — MORPHINE SULFATE 2 MG/ML IJ SOLN
2.0000 mg | INTRAMUSCULAR | Status: DC | PRN
  Administered 2014-12-05: 2 mg via INTRAVENOUS
  Filled 2014-12-04: qty 1

## 2014-12-04 MED ORDER — MAGNESIUM HYDROXIDE 400 MG/5ML PO SUSP: 30.0000 mL | Freq: Every day | ORAL | Status: DC | PRN

## 2014-12-04 MED ORDER — ACETAMINOPHEN 325 MG PO TABS
650.0000 mg | ORAL_TABLET | Freq: Once | ORAL | Status: DC
Start: 2014-12-04 — End: 2014-12-04
  Filled 2014-12-04: qty 2

## 2014-12-04 MED ORDER — ENSURE COMPLETE PO LIQD
237.0000 mL | Freq: Two times a day (BID) | ORAL | Status: DC
  Administered 2014-12-05 – 2014-12-08 (×6): 237 mL via ORAL

## 2014-12-04 MED ORDER — SODIUM CHLORIDE 0.9 % IV SOLN
INTRAVENOUS | Status: DC
  Administered 2014-12-04 – 2014-12-06 (×4): via INTRAVENOUS

## 2014-12-04 MED ORDER — HEPARIN SODIUM (PORCINE) 5000 UNIT/ML IJ SOLN
5000.0000 [IU] | Freq: Three times a day (TID) | INTRAMUSCULAR | Status: DC
  Administered 2014-12-04 – 2014-12-08 (×12): 5000 [IU] via SUBCUTANEOUS
  Filled 2014-12-04 (×11): qty 1

## 2014-12-04 MED ORDER — DOCUSATE SODIUM 100 MG PO CAPS
100.0000 mg | ORAL_CAPSULE | Freq: Two times a day (BID) | ORAL | Status: DC
  Administered 2014-12-04 – 2014-12-08 (×8): 100 mg via ORAL
  Filled 2014-12-04 (×8): qty 1

## 2014-12-04 NOTE — ED Provider Notes (Signed)
CSN: [id number]     Arrival date & time 12/04/14  0741 History   First MD Initiated Contact with Patient 12/04/14 [phone]     Chief Complaint  Patient presents with  . Leg Pain     (Consider location/radiation/quality/duration/timing/severity/associated sxs/prior Treatment) Patient is a 81 y.o. female presenting with leg pain. The history is provided by the patient.  Leg Pain Location:  Hip Injury: no   Hip location:  R hip Pain details:    Quality:  Aching   Radiates to:  Does not radiate   Severity:  Mild   Onset quality:  Unable to specify   Timing:  Constant   Progression:  Unchanged Chronicity:  New Dislocation: no   Prior injury to area:  Unable to specify Relieved by:  Nothing Worsened by:  Nothing tried Ineffective treatments:  None tried Associated symptoms: no back pain, no fatigue, no fever and no neck pain     Past Medical History  Diagnosis Date  . Hypertension   . Dementia   . Osteoarthritis   . Hyperlipidemia    History reviewed. No pertinent past surgical history. No family history on file. History  Substance Use Topics  . Smoking status: Never Smoker   . Smokeless tobacco: Not on file  . Alcohol Use: No   OB History    No data available     Review of Systems  Constitutional: Negative for fever and fatigue.  HENT: Negative for congestion and drooling.   Eyes: Negative for pain.  Respiratory: Negative for cough and shortness of breath.   Cardiovascular: Negative for chest pain.  Gastrointestinal: Negative for nausea, vomiting, abdominal pain and diarrhea.  Genitourinary: Negative for dysuria and hematuria.  Musculoskeletal: Negative for back pain, gait problem and neck pain.  Skin: Negative for color change.  Neurological: Negative for dizziness and headaches.  Hematological: Negative for adenopathy.  Psychiatric/Behavioral: Negative for behavioral problems.  All other systems reviewed and are negative.     Allergies  Oxybutynin and  Sulfa antibiotics  Home Medications   Prior to Admission medications   Medication Sig Start Date End Date Taking? Authorizing Provider  acetaminophen (TYLENOL) 325 MG tablet Take 650 mg by mouth every 8 (eight) hours as needed.   Yes Historical Provider, MD  docusate sodium (COLACE) 100 MG capsule Take 100 mg by mouth 2 (two) times daily.   Yes Historical Provider, MD  polyethylene glycol (MIRALAX / GLYCOLAX) packet Take 17 g by mouth daily.   Yes Historical Provider, MD  atorvastatin (LIPITOR) 40 MG tablet Take 40 mg by mouth daily.    Historical Provider, MD  cefpodoxime (VANTIN) 100 MG tablet Take 1 tablet (100 mg total) by mouth 2 (two) times daily. 08/27/14   Rexene Agent, MD  donepezil (ARICEPT) 5 MG tablet Take 5 mg by mouth at bedtime.    Historical Provider, MD  traMADol (ULTRAM) 50 MG tablet Take by mouth every 6 (six) hours as needed.    Historical Provider, MD   BP 104/64 mmHg  Pulse 72  Temp(Src) 98.3 F (36.8 C) (Oral)  Resp 16  SpO2 100% Physical Exam  Constitutional: She appears well-developed and well-nourished.  HENT:  Head: Normocephalic.  Mouth/Throat: Oropharynx is clear and moist. No oropharyngeal exudate.  Eyes: Conjunctivae and EOM are normal. Pupils are equal, round, and reactive to light.  Neck: Normal range of motion. Neck supple.  Cardiovascular: Normal rate, regular rhythm, normal heart sounds and intact distal pulses.  Exam reveals no gallop and  no friction rub.   No murmur heard. Pulmonary/Chest: Effort normal and breath sounds normal. No respiratory distress. She has no wheezes.  Abdominal: Soft. Bowel sounds are normal. There is no tenderness. There is no rebound and no guarding.  Musculoskeletal: She exhibits tenderness. She exhibits no edema.  Mildly limited range of motion of the right hip due to pain. Tenderness to palpation of the right lateral hip.  Normal rom of the right knee w/out pain.   No obvious swelling or erythema of the right lower  extremity.  2+ distal pulses in bilateral lower extremities.  Normal range of motion of the left hip and knee without pain.  Neurological: She is alert.  A/o x 1, doesn't know year or location.   Skin: Skin is warm and dry.  Psychiatric: She has a normal mood and affect. Her behavior is normal.  Nursing note and vitals reviewed.   ED Course  Procedures (including critical care time) Labs Review Labs Reviewed  CBC WITH DIFFERENTIAL/PLATELET - Abnormal; Notable for the following:    WBC 10.6 (*)    RBC 3.69 (*)    Hemoglobin 11.7 (*)    HCT 34.9 (*)    Neutrophils Relative % 79 (*)    Neutro Abs 8.5 (*)    All other components within normal limits  COMPREHENSIVE METABOLIC PANEL - Abnormal; Notable for the following:    Glucose, Bld 116 (*)    BUN 35 (*)    Creatinine, Ser 1.11 (*)    Albumin 3.2 (*)    GFR calc non Af Amer 44 (*)    GFR calc Af Amer 51 (*)    Anion gap 2 (*)    All other components within normal limits  URINALYSIS, ROUTINE W REFLEX MICROSCOPIC - Abnormal; Notable for the following:    APPearance CLOUDY (*)    Hgb urine dipstick LARGE (*)    Bilirubin Urine SMALL (*)    Ketones, ur 15 (*)    Protein, ur 100 (*)    Leukocytes, UA MODERATE (*)    All other components within normal limits  URINE MICROSCOPIC-ADD ON - Abnormal; Notable for the following:    Bacteria, UA MANY (*)    All other components within normal limits  URINE CULTURE  PROTIME-INR    Imaging Review Dg Hip Unilat With Pelvis 2-3 Views Right  12/04/2014   CLINICAL DATA:  Right hip pain starting this morning, decreased range of motion, denies injury, initial encounter.  EXAM: RIGHT HIP (WITH PELVIS) 2-3 VIEWS  COMPARISON:  Sagittal and coronal reformatted images CT abdomen pelvis 07/22/2014.  FINDINGS: Bilateral hip arthroplasties without dislocation. There is a new displaced fracture at the base of the right greater trochanter. Obturator rings are intact.  IMPRESSION: Minimally displaced  right greater trochanter fracture adjacent to a right hip arthroplasty.   Electronically Signed   By: Lorin Picket M.D.   On: 12/04/2014 08:21     EKG Interpretation   Date/Time:  Wednesday December 04 2014 09:08:37 EST Ventricular Rate:  69 PR Interval:  170 QRS Duration: 78 QT Interval:  440 QTC Calculation: 471 R Axis:   73 Text Interpretation:  Normal sinus rhythm Normal ECG No significant change  since last tracing Confirmed by Chrislyn Seedorf  MD, Haidynn Almendarez ([id number]) on 12/04/2014  9:15:11 AM      MDM   Final diagnoses:  Right hip pain  Closed right hip fracture, initial encounter  UTI (lower urinary tract infection)    7:59 AM 81  y.o. female with a history of dementia who presents from [facility] with right hip pain. There is no reported fall and no deformity noted on exam. The patient is alert and oriented 1 and is a poor historian. She is not sure why she is here, but denies any falls. She has some right lateral hip pain on my exam. We'll start with Tylenol on a screening plain film. There is no swelling or obvious erythema noted on my exam.  9:42 AM Discussed case w/ Dr. Len Childs ([facility]) who states that patient can have partial weightbearing as tolerated with a walker and recommends follow-up in his office in one week.  Pt also found to have UTI. Will admit for tx of UTI and pain control.   Pamella Pert, MD 12/04/14 1511

## 2014-12-04 NOTE — Progress Notes (Signed)
MD paged about the patient status, Md to come and assess patient.

## 2014-12-04 NOTE — ED Notes (Signed)
EMS reports pt is from [facility] and is c/o right thigh pain since this am. No deformity or swelling but sts there is some lateral redness. SNF staff reported to EMS that pt was mobile per her norm last night and snf staff did not know if pt had a fall.

## 2014-12-04 NOTE — ED Notes (Signed)
This RN attempted to call the patients family, both daughters were unable to be reached on the phone.

## 2014-12-04 NOTE — ED Notes (Signed)
Report called to Angie RN on [employer] 

## 2014-12-04 NOTE — Progress Notes (Addendum)
Paged Tyndall MD to notify about the admission.

## 2014-12-04 NOTE — H&P (Signed)
[facility] History and Physical                                                                                    Kaitlin Rodriguez, is a 81 y.o. female  MRN: [id number]   DOB - 1928/07/17  Admit Date - 12/04/2014  Outpatient Primary MD for the patient is Reymundo Poll, MD  With History of -  Past Medical History  Diagnosis Date  . Hypertension   . Hyperlipidemia   . Complication of anesthesia     "didn't wake up for a long time, pale, hypotension; got psychotic after one of her hip ORs in ~ 2010"  . Positive TB test     "father passed away from TB; she's negative/CXR"  . History of blood transfusion 2004    "related to hip replacement"  . Blood type, Rh negative   . Osteoarthritis     "severe in her hands" (12/04/2014)  . Alzheimer's dementia     "50% loss"  . Cancer of kidney   . Acute pancreatitis hospitalized 07/2014      Past Surgical History  Procedure Laterality Date  . Thyroid surgery  1960's    "took out tumor"  . Joint replacement    . Total hip arthroplasty Bilateral 2004-2010  . Abdominal hysterectomy    . Nephrectomy  ~ 2011    "cancer; it was contained"  . Tonsillectomy    . Breast lumpectomy      "benign"  . Cataract extraction w/ intraocular lens implant      "? side"    in for   Chief Complaint  Patient presents with  . Leg Pain     HPI  Kaitlin Rodriguez  is a 81 y.o. female, with a past medical history significant for Alzheimer's dementia, renal carcinoma, pancreatitis, hypertension, hyperlipidemia. Kaitlin Rodriguez is a very poor historian.  She presented to the [facility] from [facility] for right hip pain. Reportedly there was no witnessed fall. X-rays of her right hip show a minimally displaced right greater trochanter fracture adjacent to a previous right hip arthroplasty. The EDP discussed the case with Dr. Len Childs of [facility] orthopedics who determined this was a non-surgical case. He recommended partial weightbearing status and  follow-up in his office in one week. Kaitlin Rodriguez UA showed urinary tract infection. Labs show mild dehydration.  Review of Systems   In addition to the HPI above,  Unable to obtain as the patient has severe dementia.  Social History History  Substance Use Topics  . Smoking status: Never Smoker   . Smokeless tobacco: Never Used  . Alcohol Use: No    Family History History reviewed. No pertinent family history.  unable to obtain as the patient has severe dementia  Prior to Admission medications   Medication Sig Start Date End Date Taking? Authorizing Provider  acetaminophen (TYLENOL) 325 MG tablet Take 650 mg by mouth every 8 (eight) hours as needed.   Yes Historical Provider, MD  docusate sodium (COLACE) 100 MG capsule Take 100 mg by mouth 2 (two) times daily.   Yes Historical Provider, MD  polyethylene glycol (MIRALAX / GLYCOLAX)  packet Take 17 g by mouth daily.   Yes Historical Provider, MD  atorvastatin (LIPITOR) 40 MG tablet Take 40 mg by mouth daily.    Historical Provider, MD  cefpodoxime (VANTIN) 100 MG tablet Take 1 tablet (100 mg total) by mouth 2 (two) times daily. 08/27/14   Rexene Agent, MD  donepezil (ARICEPT) 5 MG tablet Take 5 mg by mouth at bedtime.    Historical Provider, MD  traMADol (ULTRAM) 50 MG tablet Take by mouth every 6 (six) hours as needed.    Historical Provider, MD    Allergies  Allergen Reactions  . Oxybutynin   . Sulfa Antibiotics     Physical Exam  Vitals  Blood pressure 125/70, pulse 70, temperature 98.2 F (36.8 C), temperature source Oral, resp. rate 16, height 5\' 1"  (1.549 m), weight 44.453 kg (98 lb), SpO2 100 %.   General:  Thin, frail, elderly female lying in bed in NAD  Psych: Pleasantly demented. Unaware of why she is in the hospital.  Neuro:   No F.N deficits, ALL C.Nerves Intact, Strength 5/5 all 4 extremities, Sensation intact all 4 extremities.  ENT:  Ears and Eyes appear Normal, Conjunctivae clear, PER. Slightly dry oral  mucosa.++ Erythema and white exudate in her posterior oropharynx.  Neck:  Supple, No lymphadenopathy appreciated  Respiratory:  Symmetrical chest wall movement, Good air movement bilaterally, CTAB.  Cardiac:  RRR, No Murmurs, no LE edema noted, no JVD.    Abdomen:  Positive bowel sounds, Soft, Non tender, Non distended,  No masses appreciated  Skin:  No Cyanosis, Normal Skin Turgor, No Skin Rash or Bruise.  Extremities:  Able to move all 4. 5/5 strength in each,  no effusions.  Data Review  CBC  Recent Labs Lab 12/04/14 0830  WBC 10.6*  HGB 11.7*  HCT 34.9*  PLT 353  MCV 94.6  MCH 31.7  MCHC 33.5  RDW 14.3  LYMPHSABS 1.4  MONOABS 0.7  EOSABS 0.0  BASOSABS 0.1    Chemistries   Recent Labs Lab 12/04/14 0830  NA 135  K 3.5  CL 109  CO2 24  GLUCOSE 116*  BUN 35*  CREATININE 1.11*  CALCIUM 8.5  AST 25  ALT 12  ALKPHOS 46  BILITOT 0.5     Coagulation profile  Recent Labs Lab 12/04/14 0830  INR 1.08     Urinalysis    Component Value Date/Time   COLORURINE YELLOW 12/04/2014 1045   APPEARANCEUR CLOUDY* 12/04/2014 1045   LABSPEC 1.027 12/04/2014 1045   PHURINE 8.0 12/04/2014 1045   GLUCOSEU NEGATIVE 12/04/2014 1045   HGBUR LARGE* 12/04/2014 1045   BILIRUBINUR SMALL* 12/04/2014 1045   KETONESUR 15* 12/04/2014 1045   PROTEINUR 100* 12/04/2014 1045   UROBILINOGEN 1.0 12/04/2014 1045   NITRITE NEGATIVE 12/04/2014 1045   LEUKOCYTESUR MODERATE* 12/04/2014 1045    Imaging results:   Dg Hip Unilat With Pelvis 2-3 Views Right  12/04/2014   CLINICAL DATA:  Right hip pain starting this morning, decreased range of motion, denies injury, initial encounter.  EXAM: RIGHT HIP (WITH PELVIS) 2-3 VIEWS  COMPARISON:  Sagittal and coronal reformatted images CT abdomen pelvis 07/22/2014.  FINDINGS: Bilateral hip arthroplasties without dislocation. There is a new displaced fracture at the base of the right greater trochanter. Obturator rings are intact.  IMPRESSION:  Minimally displaced right greater trochanter fracture adjacent to a right hip arthroplasty.   Electronically Signed   By: Lorin Picket M.D.   On: 12/04/2014 08:21  My personal review of EKG: NSR, No ST changes noted.   Assessment & Plan  Active Problems:   UTI (lower urinary tract infection)   Closed right hip fracture   Alzheimer's dementia   Right hip fracture Nonoperable. Follow-up with orthopedic surgery (Dr. Len Childs) in 1 week Partial weightbearing status. Physical therapy and occupational therapy have been ordered. Pain control with Tylenol, Vicodin, and morphine when necessary Social work consult as [employer] may not be able to accommodate her return. DTRs Kaitlin Rodriguez and Kaitlin Rodriguez may want their mother to move to the [place] area to be closer to [facility])  Urinary tract infection with hematuria. UA shows many bacteria. Started on Rocephin. Culture pending.  Mild dehydration On clinical exam and elevated BUN Will give gentle IVF for 24 hours.  Alzheimer's dementia Per daughter she develops psychosis when she is in the hospital and sundowns. Sitter ordered for safety. Haldol PO PRN.   Erythema and exudate in oropharynx. Swab for rapid strep.   DVT Prophylaxis:  Heparin  AM Labs Ordered, also please review Full Orders  Family Communication:   DTR Kaitlin Rodriguez at bedside.  Code Status:  DNR  Condition:  Stable.  Time spent in minutes : [place],  PA-C on 12/04/2014 at 5:51 PM  Between 7am to 7pm - Pager - [phone]  After 7pm go to [email or web address] - password [email or web address]  And look for the night coverage person covering me after hours  [facility]  Addendum  Patient is a pleasant 81 year old female with a past medical history of dementia, history of renal cell carcinoma, who initially presented to [facility] from her facility for further evaluation of right hip pain. Nursing staff did not report witnessed fall. Patient has  advanced dementia, cannot provide history. Imaging studies revealed a right greater trochanter fracture. This was discussed with Dr. Len Childs of orthopedic surgery did not recommend ORIF, and managing conservatively. During my evaluation patient is pleasantly confused disoriented, in no acute distress. Last revealed the presence of a UTI. We'll start empiric ceftriaxone 1 g IV every 24 hours along with IV fluids running at 75 mL/hour. Physical therapy/occupational therapy consultation.

## 2014-12-04 NOTE — ED Notes (Signed)
Patient transported to X-ray 

## 2014-12-04 NOTE — Progress Notes (Signed)
Called MD to notify about the admission.

## 2014-12-04 NOTE — Progress Notes (Signed)
Dr. Eliseo Squires called ansd stated that she is not the attending

## 2014-12-04 NOTE — ED Notes (Addendum)
Spoke with Lorre Nick at [facility] who is going to call me back in regards to pts orthopedic.

## 2014-12-05 ENCOUNTER — Inpatient Hospital Stay (HOSPITAL_COMMUNITY): Payer: Medicare Other

## 2014-12-05 DIAGNOSIS — S72001D Fracture of unspecified part of neck of right femur, subsequent encounter for closed fracture with routine healing: Secondary | ICD-10-CM

## 2014-12-05 LAB — BASIC METABOLIC PANEL
Anion gap: 8 (ref 5–15)
BUN: 26 mg/dL — ABNORMAL HIGH (ref 6–23)
CO2: 25 mmol/L (ref 19–32)
Calcium: 8.5 mg/dL (ref 8.4–10.5)
Chloride: 108 mmol/L (ref 96–112)
Creatinine, Ser: 1.02 mg/dL (ref 0.50–1.10)
GFR calc Af Amer: 56 mL/min — ABNORMAL LOW (ref >90)
GFR calc non Af Amer: 49 mL/min — ABNORMAL LOW (ref >90)
GLUCOSE: 96 mg/dL (ref 70–99)
POTASSIUM: 3.6 mmol/L (ref 3.5–5.1)
SODIUM: 141 mmol/L (ref 135–145)

## 2014-12-05 LAB — CBC
HCT: 33.2 % — ABNORMAL LOW (ref 36.0–46.0)
Hemoglobin: 11 g/dL — ABNORMAL LOW (ref 12.0–15.0)
MCH: 31.5 pg (ref 26.0–34.0)
MCHC: 33.1 g/dL (ref 30.0–36.0)
MCV: 95.1 fL (ref 78.0–100.0)
PLATELETS: 322 10*3/uL (ref 150–400)
RBC: 3.49 MIL/uL — AB (ref 3.87–5.11)
RDW: 14.8 % (ref 11.5–15.5)
WBC: 10 10*3/uL (ref 4.0–10.5)

## 2014-12-05 LAB — RAPID STREP SCREEN (MED CTR MEBANE ONLY): Streptococcus, Group A Screen (Direct): NEGATIVE

## 2014-12-05 NOTE — Progress Notes (Signed)
INITIAL NUTRITION ASSESSMENT  DOCUMENTATION CODES Per approved criteria  -Not Applicable   INTERVENTION: -Continue Ensure Complete po BID, each supplement provides 350 kcal and 13 grams of protein  NUTRITION DIAGNOSIS: Predicted suboptimal nutrient intake related to dementia, advanced age as evidenced by PO: 0-45%.   Goal: Pt will meet >90% of estimated nutritional needs  Monitor:  PO/supplement intake, labs, weight changes, I/O's  Reason for Assessment: MST=2  81 y.o. female  Admitting Dx: <principal problem not specified>  Kaitlin Rodriguez is a 81 y.o. female, with a past medical history significant for Alzheimer's dementia, renal carcinoma, pancreatitis, hypertension, hyperlipidemia. Kaitlin Rodriguez is a very poor historian. She presented to the [facility] from [facility] for right hip pain. Reportedly there was no witnessed fall. X-rays of her right hip show a minimally displaced right greater trochanter fracture adjacent to a previous right hip arthroplasty.   ASSESSMENT: Pt admitted with rt hip fx, s/p fall. Pt is a resident of a local ALF facility. Pt unable to provide hx due to advanced dementia. Noted 0-45% meal intake. No weight hx is available. Suspect poor po intake PTA due to advanced dementia and age. Pt in with MD at time of visit; nutrition-focused physical exam deferred at this time. Pt already has Ensure Complete BID ordered- RD will continue supplement.  PT assessment recommending SNF placement. Labs reviewed. BUN: 26.   Height: Ht Readings from Last 1 Encounters:  12/04/14 5\' 1"  (1.549 m)    Weight: Wt Readings from Last 1 Encounters:  12/04/14 98 lb (44.453 kg)    Ideal Body Weight: 105#  % Ideal Body Weight: 93%  Wt Readings from Last 10 Encounters:  12/04/14 98 lb (44.453 kg)    Usual Body Weight: unknown  % Usual Body Weight: unknown  BMI:  Body mass index is 18.53 kg/(m^2). Normal weight range  Estimated Nutritional  Needs: Kcal: 1300-1500 Protein: 50-60 grams Fluid: 1.3-1.5 L  Skin: WDL  Diet Order: Diet Heart  EDUCATION NEEDS: -Education not appropriate at this time   Intake/Output Summary (Last 24 hours) at 12/05/14 1005 Last data filed at 12/04/14 2200  Gross per 24 hour  Intake    225 ml  Output    300 ml  Net    -75 ml    Last BM: PTA  Labs:   Recent Labs Lab 12/04/14 0830 12/04/14 1926 12/05/14 0630  NA 135  --  141  K 3.5  --  3.6  CL 109  --  108  CO2 24  --  25  BUN 35*  --  26*  CREATININE 1.11* 1.30* 1.02  CALCIUM 8.5  --  8.5  GLUCOSE 116*  --  96    CBG (last 3)  No results for input(s): GLUCAP in the last 72 hours.  Scheduled Meds: . atorvastatin  40 mg Oral Daily  . cefTRIAXone (ROCEPHIN)  IV  1 g Intravenous Q24H  . docusate sodium  100 mg Oral BID  . donepezil  5 mg Oral QHS  . feeding supplement (ENSURE COMPLETE)  237 mL Oral BID BM  . heparin subcutaneous  5,000 Units Subcutaneous 3 times per day  . polyethylene glycol  17 g Oral Daily    Continuous Infusions: . sodium chloride 75 mL/hr at 12/05/14 2637    Past Medical History  Diagnosis Date  . Hypertension   . Hyperlipidemia   . Complication of anesthesia     "didn't wake up for a long time,  pale, hypotension; got psychotic after one of her hip ORs in ~ 2010"  . Positive TB test     "father passed away from TB; she's negative/CXR"  . History of blood transfusion 2004    "related to hip replacement"  . Blood type, Rh negative   . Osteoarthritis     "severe in her hands" (12/04/2014)  . Alzheimer's dementia     "50% loss"  . Cancer of kidney   . Acute pancreatitis hospitalized 07/2014    Past Surgical History  Procedure Laterality Date  . Thyroid surgery  1960's    "took out tumor"  . Joint replacement    . Total hip arthroplasty Bilateral 2004-2010  . Abdominal hysterectomy    . Nephrectomy  ~ 2011    "cancer; it was contained"  . Tonsillectomy    . Breast lumpectomy       "benign"  . Cataract extraction w/ intraocular lens implant      "? side"    Yasmen Cortner A. Jimmye Norman, RD, LDN, CDE Pager: [phone] After hours Pager: [phone]

## 2014-12-05 NOTE — Progress Notes (Signed)
OT Cancellation Note  Patient Details Name: Kaitlin Rodriguez MRN: [id number] DOB: [birth year]   Cancelled Treatment:    Reason Eval/Treat Not Completed: Medical issues which prohibited therapy. Per PT, pt with Rt shoulder pain and limited ROM. Will await ortho consult and/or x-ray before proceeding with therapy. Pt is from ALF and PT recommending SNF at this time.   Villa Herb M 12/05/2014, 10:01 AM

## 2014-12-05 NOTE — Progress Notes (Signed)
PROGRESS NOTE  [name] RKY:[id number] DOB: 04/30/1928 DOA: 12/04/2014 PCP: Reymundo Poll, MD  HPI: Kaitlin Rodriguez is an 81 year old female with past medical history of Alzheimer's dementia, renal carcinoma, HTN, and hyperlipidemia who presented to the [facility] ED on 12/04/14 following an unwitnessed fall at her assisted living facility. Patient is unable to provide many details due to dementia.   In the ED, right hip x-ray shows minimally displaced greater trochanter fracture adjacent to previous arthroplasty. Orthopedics consulted (Dr. Len Childs) and determined to be non-surgical and arranged follow up once patient is discharged.  UA positive for moderate leukocytes and many bacteria so started on Rocephin. Patient had mild leukocytosis with WBC 10.6. Creatinine was increased to 1.30.  Subjective Today, Kaitlin Rodriguez is feeling much better. She is still experiencing pain but states it is primarily in her right lower back. She was unable to participate in physical therapy because of right shoulder pain.   Assessment/Plan:  UTI (lower urinary tract infection) - Patient presented with dysuria and signs of infection on UA - Started on Rocephin  - Patient is afebrile (98.6) and has a normal white count (10.0) - Continue ABX - urine cultures pending  Closed right hip fracture - Presented with minimally displaced fracture of the greater trochanter as seen on x-ray - EDP discussed with Dr. Len Childs of [facility] orthopedics who determined was non-surgical case and arranged outpatient follow up - Physical therapy evaluation ordered, will need higher care than assisted living, SW consult - discussed with Dr. Veverly Fells office and confirmed that this is non-operative, non weight bearing  Right shoulder pain - XR with chronic rotator cuff injury without acute findings.  Alzheimer's dementia - Patient has documented history of Alzheimer's dementia  - Not currently on any medication - Social work  consult for placement   Sore throat  - rapid strep negative  DVT Prophylaxis:  Heparin    Code Status: DNR Family Communication: Daughter at bedside  Disposition Plan: Remain inpatient    Consultants:  Social work  Physical therapy   Orthopedics   Procedures:  None   Antibiotics: Ceftriaxone 3/9 >>  Objective: Filed Vitals:   12/04/14 1315 12/04/14 1502 12/04/14 2103 12/05/14 0615  BP: 146/62 125/70 124/75 111/69  Pulse: 84 70 72 73  Temp: 98.3 F (36.8 C) 98.2 F (36.8 C) 98.5 F (36.9 C) 98.5 F (36.9 C)  TempSrc:  Oral Oral Oral  Resp: 16 16 15 15   Height:  5\' 1"  (1.549 m)    Weight:  44.453 kg (98 lb)    SpO2: 100% 100% 98% 100%    Intake/Output Summary (Last 24 hours) at 12/05/14 1422 Last data filed at 12/05/14 1202  Gross per 24 hour  Intake    785 ml  Output    701 ml  Net     84 ml   Filed Weights   12/04/14 1502  Weight: 44.453 kg (98 lb)   Exam: General: Well developed, well nourished, NAD, appears stated age  HEENT:  Anicteic Sclera, MMM. Mild pharyngeal erythema, no exudates Neck: Supple Cardiovascular: RRR, S1 S2 auscultated, no rubs, murmurs or gallops.   Respiratory: Clear to auscultation bilaterally with equal chest rise  Abdomen: Soft, nontender, nondistended, + bowel sounds  Extremities: warm dry without cyanosis clubbing or edema.  Neuro: Alert, Oriented to person, but not place and time. Strength 5/5 on left upper and lower extremity. Strength diminished (3/5) in right leg (due to fracture) and right shoulder.  Right leg is neurovascularly intact.  Skin: Without rashes exudates or nodules.   Psych: Normal affect and demeanor, confused.   Data Reviewed: Basic Metabolic Panel:  Recent Labs Lab 12/04/14 0830 12/04/14 1926 12-10-14 0630  NA 135  --  141  K 3.5  --  3.6  CL 109  --  108  CO2 24  --  25  GLUCOSE 116*  --  96  BUN 35*  --  26*  CREATININE 1.11* 1.30* 1.02  CALCIUM 8.5  --  8.5   Liver Function  Tests:  Recent Labs Lab 12/04/14 0830  AST 25  ALT 12  ALKPHOS 46  BILITOT 0.5  PROT 6.6  ALBUMIN 3.2*   CBC:  Recent Labs Lab 12/04/14 0830 12/04/14 1926 Dec 10, 2014 0630  WBC 10.6* 10.9* 10.0  NEUTROABS 8.5*  --   --   HGB 11.7* 11.8* 11.0*  HCT 34.9* 35.7* 33.2*  MCV 94.6 93.5 95.1  PLT 353 357 322   Cardiac Enzymes:  Recent Labs Lab 12/04/14 1926  TROPONINI <0.03   Studies: Dg Shoulder Right Port  10-Dec-2014   CLINICAL DATA:  Right shoulder pain, fall  EXAM: PORTABLE RIGHT SHOULDER - 2+ VIEW  COMPARISON:  08/27/2014  FINDINGS: Moderate acromioclavicular joint hypertrophy. No fracture or acute dislocation. Elevation of the humerus relative to the glenoid stable from prior study most consistent with chronic rotator cuff tear. Mild degenerative change at the greater tuberosity at cuff tendon insertion site.  IMPRESSION: Chronic rotator cuff tear with elevation of the humerus similar to prior study. No evidence of fracture or other acute traumatic abnormality.   Electronically Signed   By: Skipper Cliche M.D.   On: 2014/12/10 10:20   Dg Hip Unilat With Pelvis 2-3 Views Right  12/04/2014   CLINICAL DATA:  Right hip pain starting this morning, decreased range of motion, denies injury, initial encounter.  EXAM: RIGHT HIP (WITH PELVIS) 2-3 VIEWS  COMPARISON:  Sagittal and coronal reformatted images CT abdomen pelvis 07/22/2014.  FINDINGS: Bilateral hip arthroplasties without dislocation. There is a new displaced fracture at the base of the right greater trochanter. Obturator rings are intact.  IMPRESSION: Minimally displaced right greater trochanter fracture adjacent to a right hip arthroplasty.   Electronically Signed   By: Lorin Picket M.D.   On: 12/04/2014 08:21    Scheduled Meds: . atorvastatin  40 mg Oral Daily  . cefTRIAXone (ROCEPHIN)  IV  1 g Intravenous Q24H  . docusate sodium  100 mg Oral BID  . donepezil  5 mg Oral QHS  . feeding supplement (ENSURE COMPLETE)  237  mL Oral BID BM  . heparin subcutaneous  5,000 Units Subcutaneous 3 times per day  . polyethylene glycol  17 g Oral Daily   Continuous Infusions: . sodium chloride 75 mL/hr at 10-Dec-2014 3614    Active Problems:   UTI (lower urinary tract infection)   Closed right hip fracture   Alzheimer's dementia   Raspect, Erin, PA-S [facility] 12-10-14, 2:22 PM    Costin M. Cruzita Lederer, MD [facility] [phone]

## 2014-12-05 NOTE — Clinical Social Work Note (Signed)
CSW met with patient and her daughter to discuss SNF placement.  CSW explained process for SNF placement, patient and daughter in agreement to going to SNF for short term rehab.  Patient has been faxed out, and FL2 and DNR on chart awaiting signature for physician.  Assessment to follow.  Jones Broom. [place], MSW, [place] 12/05/2014 4:50 PM

## 2014-12-05 NOTE — Evaluation (Signed)
Physical Therapy Evaluation Patient Details Name: Kaitlin Rodriguez MRN: [id number] DOB: Apr 02, 1928 Today's Date: 12/05/2014   History of Present Illness  adm with Rt hip pain; + greater troch fx; per MD note ortho states non-surgical management and PWB PMHX- dementia; bil THA, OA,   Clinical Impression  Patient is s/p above diagnosis resulting in functional limitations due to the deficits listed below (see PT Problem List). Patient significantly limited by pain in Rt hip and also RIGHT SHOULDER. Patient will benefit from skilled PT to increase their independence and safety with mobility to allow discharge to the venue listed below. Appears pt was admitted from an Assisted Living facility. She will now need a higher level of care.    Follow Up Recommendations SNF;Supervision/Assistance - 24 hour    Equipment Recommendations  None recommended by PT    Recommendations for Other Services OT consult     Precautions / Restrictions Precautions Precautions: Fall Precaution Comments: ?bil hip precautions Restrictions Weight Bearing Restrictions: Yes RLE Weight Bearing: Partial weight bearing      Mobility  Bed Mobility Overal bed mobility: Needs Assistance;+2 for physical assistance Bed Mobility: Supine to Sit     Supine to sit: Total assist;+2 for physical assistance     General bed mobility comments: pt able to scoot laterally in supine with max assist and bed pad; pt tenses up with supine to sit (not resisting, but unable to assist either)  Transfers Overall transfer level: Needs assistance Equipment used: Rolling walker (2 wheeled) Transfers: Sit to/from [facility] Sit to Stand: Mod assist;+2 physical assistance Stand pivot transfers: Mod assist       General transfer comment: pt with poor use of RUE on RW due to Rt shoulder pain; attempted stepping LLE twice and then self-limited herself to "shimmy" on her Lt foot due to Rt hip pain in weight bearing;  stand-pivot x 2 due to switching out recliner  Ambulation/Gait             General Gait Details: unable   Stairs            Wheelchair Mobility    Modified Rankin (Stroke Patients Only)       Balance Overall balance assessment: Needs assistance Sitting-balance support: No upper extremity supported;Feet supported Sitting balance-Leahy Scale: Poor   Postural control: Posterior lean;Left lateral lean Standing balance support: Single extremity supported Standing balance-Leahy Scale: Poor                               Pertinent Vitals/Pain Pain Assessment: Faces Faces Pain Scale: Hurts whole lot Pain Location: Rt hip & Rt shoulder Pain Intervention(s): Limited activity within patient's tolerance;Monitored during session;Repositioned;Patient requesting pain meds-RN notified    Home Living Family/patient expects to be discharged to:: Skilled nursing facility                 Additional Comments: ?was in ALF PTA    Prior Function Level of Independence:  (unknown (no family present))               Hand Dominance        Extremity/Trunk Assessment   Upper Extremity Assessment: Defer to OT evaluation (painful Rt shoulder; resists movement; RN made aware)           Lower Extremity Assessment: RLE deficits/detail RLE Deficits / Details: AAROM hip flexion to 50 supine (80 in sitting); knee WFL; ankle WFL;  Cervical / Trunk Assessment: Other exceptions  Communication   Communication: No difficulties  Cognition Arousal/Alertness: Awake/alert Behavior During Therapy: WFL for tasks assessed/performed Overall Cognitive Status: No family/caregiver present to determine baseline cognitive functioning (h/o dementia)                      General Comments      Exercises General Exercises - Lower Extremity Ankle Circles/Pumps: AROM;Both;10 reps Quad Sets: AROM;Right;5 reps Heel Slides: AROM;Left;AAROM;Right;10 reps       Assessment/Plan    PT Assessment Patient needs continued PT services  PT Diagnosis Difficulty walking;Acute pain   PT Problem List Decreased strength;Decreased range of motion;Decreased activity tolerance;Decreased balance;Decreased mobility;Decreased cognition;Decreased knowledge of use of DME;Decreased knowledge of precautions;Pain  PT Treatment Interventions DME instruction;Gait training;Functional mobility training;Therapeutic activities;Therapeutic exercise;Cognitive remediation;Patient/family education   PT Goals (Current goals can be found in the Care Plan section) Acute Rehab PT Goals Patient Stated Goal: agrees wants to get walking again PT Goal Formulation: With patient Time For Goal Achievement: 12/12/14 Potential to Achieve Goals: Good    Frequency Min 3X/week   Barriers to discharge        Co-evaluation               End of Session Equipment Utilized During Treatment: Gait belt Activity Tolerance: Patient limited by pain Patient left: in chair;with call bell/phone within reach;with nursing/sitter in room Nurse Communication: Mobility status (Rt shoulder pain)         Time: 7017-7939 PT Time Calculation (min) (ACUTE ONLY): 21 min   Charges:   PT Evaluation $Initial PT Evaluation Tier I: 1 Procedure     PT G Codes:        Anthonyjames Bargar 01/04/15, 9:06 AM Pager [phone]

## 2014-12-05 NOTE — Progress Notes (Signed)
At 1355, back of throat swabbed for rapid strep and sent to lab.

## 2014-12-06 LAB — URINE CULTURE
Colony Count: NO GROWTH
Culture: NO GROWTH

## 2014-12-06 NOTE — Progress Notes (Signed)
PROGRESS NOTE  Kaitlin Rodriguez CHE:[id number] DOB: 07-29-1928 DOA: 12/04/2014 PCP: Reymundo Poll, MD  HPI: Kaitlin Rodriguez is an 81 year old female with past medical history of Alzheimer's dementia, renal carcinoma, HTN, and hyperlipidemia who presented to the [facility] ED on 12/04/14 following an unwitnessed fall at her assisted living facility. Patient is unable to provide many details due to dementia.   In the ED, right hip x-ray shows minimally displaced greater trochanter fracture adjacent to previous arthroplasty. Orthopedics consulted (Dr. Len Childs) and determined to be non-surgical and arranged follow up once patient is discharged.  UA positive for moderate leukocytes and many bacteria so started on Rocephin. Patient had mild leukocytosis with WBC 10.6. Creatinine was increased to 1.30.  Subjective Sitting in chair, denies pain currently.   Assessment/Plan:  UTI (lower urinary tract infection) - Patient presented with dysuria and signs of infection on UA - Started on Rocephin  - Patient is afebrile (98.6) and has a normal white count (10.0) - Continue ABX - urine cultures pending  Closed right hip fracture - Presented with minimally displaced fracture of the greater trochanter as seen on x-ray - EDP discussed with Dr. Len Childs of [facility] orthopedics who determined was non-surgical case and arranged outpatient follow up - Physical therapy evaluation ordered, will need higher care than assisted living, SW consult - discussed with Dr. Veverly Fells office and confirmed that this is non-operative, non weight bearing  Right shoulder pain - XR with chronic rotator cuff injury without acute findings.  Alzheimer's dementia - Patient has documented history of Alzheimer's dementia  - Not currently on any medication - Social work consult for placement   Sore throat  - rapid strep negative  DVT Prophylaxis:  Heparin    Code Status: DNR Family Communication: no family at bedside Disposition  Plan: SNF 3/12   Consultants:  Social work  Physical therapy   Orthopedics   Procedures:  None   Antibiotics: Ceftriaxone 3/9 >>  Objective: Filed Vitals:   12/05/14 0615 12/05/14 1400 12/05/14 2114 12/06/14 0616  BP: 111/69 131/50 120/69 126/36  Pulse: 73 72 70 70  Temp: 98.5 F (36.9 C) 98.6 F (37 C) 98.8 F (37.1 C) 98.5 F (36.9 C)  TempSrc: Oral Oral Oral Oral  Resp: 15 16 19 19   Height:      Weight:      SpO2: 100% 100% 100% 100%    Intake/Output Summary (Last 24 hours) at 12/06/14 1330 Last data filed at 12/06/14 1200  Gross per 24 hour  Intake   1771 ml  Output    250 ml  Net   1521 ml   Filed Weights   12/04/14 1502  Weight: 44.453 kg (98 lb)   Exam: General: Frail, demented elderly female sitting in chair, NAD  HEENT:  Anicteic Sclera, MMM. Mild pharyngeal erythema, no exudates Neck: Supple Cardiovascular: RRR, S1 S2 auscultated, no rubs, murmurs or gallops.   Respiratory: Clear to auscultation bilaterally with equal chest rise  Abdomen: Soft, nontender, nondistended, + bowel sounds  Extremities: warm dry without cyanosis clubbing or edema.  Neuro: Alert, Oriented to person, but not place and time. Strength 5/5 on left upper and lower extremity. Strength diminished (3/5) in right leg (due to fracture) and right shoulder. Right leg is neurovascularly intact.  Skin: Without rashes exudates or nodules.   Psych: Normal affect and demeanor, confused.   Data Reviewed: Basic Metabolic Panel:  Recent Labs Lab 12/04/14 0830 12/04/14 1926 12/05/14 0630  NA  135  --  141  K 3.5  --  3.6  CL 109  --  108  CO2 24  --  25  GLUCOSE 116*  --  96  BUN 35*  --  26*  CREATININE 1.11* 1.30* 1.02  CALCIUM 8.5  --  8.5   Liver Function Tests:  Recent Labs Lab 12/04/14 0830  AST 25  ALT 12  ALKPHOS 46  BILITOT 0.5  PROT 6.6  ALBUMIN 3.2*   CBC:  Recent Labs Lab 12/04/14 0830 12/04/14 1926 2014/12/21 0630  WBC 10.6* 10.9* 10.0  NEUTROABS  8.5*  --   --   HGB 11.7* 11.8* 11.0*  HCT 34.9* 35.7* 33.2*  MCV 94.6 93.5 95.1  PLT 353 357 322   Cardiac Enzymes:  Recent Labs Lab 12/04/14 1926  TROPONINI <0.03   Studies: Dg Shoulder Right Port  Dec 21, 2014   CLINICAL DATA:  Right shoulder pain, fall  EXAM: PORTABLE RIGHT SHOULDER - 2+ VIEW  COMPARISON:  08/27/2014  FINDINGS: Moderate acromioclavicular joint hypertrophy. No fracture or acute dislocation. Elevation of the humerus relative to the glenoid stable from prior study most consistent with chronic rotator cuff tear. Mild degenerative change at the greater tuberosity at cuff tendon insertion site.  IMPRESSION: Chronic rotator cuff tear with elevation of the humerus similar to prior study. No evidence of fracture or other acute traumatic abnormality.   Electronically Signed   By: Skipper Cliche M.D.   On: December 21, 2014 10:20    Scheduled Meds: . atorvastatin  40 mg Oral Daily  . cefTRIAXone (ROCEPHIN)  IV  1 g Intravenous Q24H  . docusate sodium  100 mg Oral BID  . donepezil  5 mg Oral QHS  . feeding supplement (ENSURE COMPLETE)  237 mL Oral BID BM  . heparin subcutaneous  5,000 Units Subcutaneous 3 times per day  . polyethylene glycol  17 g Oral Daily   Continuous Infusions:    Active Problems:   UTI (lower urinary tract infection)   Closed right hip fracture   Alzheimer's dementia  Florencia Reasons MD/PhD [phone] [facility] 7pm to 7am please see Amion

## 2014-12-06 NOTE — Care Management Note (Signed)
  Page 1 of 1   12/06/2014     11:41:27 AM CARE MANAGEMENT NOTE 12/06/2014  Patient:  Kaitlin Rodriguez, Kaitlin Rodriguez   Account Number:  [id number]  Date Initiated:  12/06/2014  Documentation initiated by:  Magdalen Spatz  Subjective/Objective Assessment:     Action/Plan:   Anticipated DC Date:  12/07/2014   Anticipated DC Plan:  [place]  In-house referral  Clinical Social Worker         Choice offered to / List presented to:             Status of service:   Medicare Important Message given?  YES (If response is "NO", the following Medicare IM given date fields will be blank) Date Medicare IM given:  12/06/2014 Medicare IM given by:  Magdalen Spatz Date Additional Medicare IM given:   Additional Medicare IM given by:    Discharge Disposition:  [place]  Per UR Regulation:  Reviewed for med. necessity/level of care/duration of stay  If discussed at [place] of Stay Meetings, dates discussed:    Comments:

## 2014-12-06 NOTE — Clinical Social Work Note (Signed)
CSW presented bed offers to patient and her family.  Patient would like to go to [facility] and [facility].  Contacted SNF which does not have a bed available for tomorrow, but SNF can admit patient on Sunday when the bed will be available.  CSW will relay information to weekend social worker to contact [place] on Saturday morning at [phone].  Jones Broom. [place], MSW, [place] 12/06/2014 4:21 PM

## 2014-12-06 NOTE — Progress Notes (Signed)
OT Cancellation Note  Patient Details Name: Kaitlin Rodriguez MRN: [id number] DOB: 1928/04/04   Cancelled Treatment:    Reason Eval/Treat Not Completed: Other (comment) Pt is Medicare and current D/C plan is SNF. No apparent immediate acute care OT needs, therefore will defer OT to SNF. If OT eval is needed please call Acute Rehab Dept. at [phone] or text page OT at [phone].    Almon Register [phone] 12/06/2014, 7:20 AM

## 2014-12-06 NOTE — Clinical Social Work Note (Signed)
Clinical Social Work Department BRIEF PSYCHOSOCIAL ASSESSMENT 12/06/2014  Patient:  Kaitlin Rodriguez, Kaitlin Rodriguez     Account Number:  [id number]     Admit date:  12/04/2014  Clinical Social Worker:  Dian Queen  Date/Time:  12/05/2014 05:26 PM  Referred by:  Physician  Date Referred:  12/05/2014 Referred for  SNF Placement   Other Referral:   Interview type:  Patient Other interview type:   Daughter    PSYCHOSOCIAL DATA Living Status:  FACILITY Admitted from facility:  [facility] Level of care:  Assisted Living Primary support name:  Kaitlin Rodriguez Primary support relationship to patient:  FAMILY Degree of support available:   Patient lives in an ALF and receives care with her ADLs from staff at facility    CURRENT CONCERNS Current Concerns  Post-Acute Placement   Other Concerns:    [place] / PLAN Patient is a pleasantly confused 81 year old female who lives at an ALF called [facility]  Patient has two daughters one is in [place] the other is from [place]. Patient is alert and oriented x2, but talkative.  Patient and family realize that patient needs to go to short term rehab to improve her strenghthening before returing back to ALF.  Patient expressed that she has been in rehab before and was pleased with her experience.  Patient will be discharged to SNF once she is medically ready and discharge orders have been received.   Assessment/plan status:  Psychosocial Support/Ongoing Assessment of Needs Other assessment/ plan:   Information/referral to community resources:    PATIENT'S/FAMILY'S RESPONSE TO PLAN OF CARE: Patient and daughter in agreement to going to short term rehab at a SNF.    Kaitlin Rodriguez, MSW, [place] 12/06/2014 5:37 PM

## 2014-12-06 NOTE — Progress Notes (Signed)
Physical Therapy Treatment Patient Details Name: Kaitlin Rodriguez MRN: [id number] DOB: [birth year] Today's Date: 12/19/14    History of Present Illness adm with Rt hip pain; + greater troch fx; per MD note ortho states non-surgical management and PWB PMHX- dementia; bil THA, OA,     PT Comments    Pt was seen for bed mob and bed ex as she had just gotten back to bed from chair and was not interested in doing any standing.  Her tolerance for activity improved by end of session but still is not getting up to chair with PT.  Talked with her about SNF plan, which sounds very appropriate.  Follow Up Recommendations  SNF;Supervision/Assistance - 24 hour     Equipment Recommendations  None recommended by PT    Recommendations for Other Services OT consult     Precautions / Restrictions Precautions Precautions: Fall Precaution Comments: ?bil hip precautions Restrictions Weight Bearing Restrictions: Yes RLE Weight Bearing: Partial weight bearing    Mobility  Bed Mobility Overal bed mobility: Needs Assistance Bed Mobility: Rolling Rolling: Mod assist   Supine to sit:  (declined)        Transfers                 General transfer comment: declined  Ambulation/Gait             General Gait Details: declined   Financial trader Rankin (Stroke Patients Only)       Balance                                    Cognition Arousal/Alertness: Awake/alert Behavior During Therapy: WFL for tasks assessed/performed Overall Cognitive Status: Within Functional Limits for tasks assessed                      Exercises General Exercises - Lower Extremity Ankle Circles/Pumps: AROM;AAROM;Both;5 reps Quad Sets: AROM;Both;5 reps Gluteal Sets: AROM;Both;5 reps Heel Slides: AROM;AAROM;Both;20 reps Hip ABduction/ADduction: AAROM;AROM;Both;20 reps Straight Leg Raises: AAROM;AROM;Both;10 reps    General  Comments        Pertinent Vitals/Pain Pain Assessment: Faces Pain Score: 8  Faces Pain Scale: Hurts whole lot Pain Location: R hip Pain Intervention(s): Limited activity within patient's tolerance;Monitored during session;Premedicated before session;Repositioned    Home Living                      Prior Function            PT Goals (current goals can now be found in the care plan section) Acute Rehab PT Goals Patient Stated Goal: agrees wants to get walking again Progress towards PT goals: Progressing toward goals (slowly)    Frequency  Min 3X/week    PT Plan Current plan remains appropriate    Co-evaluation             End of Session   Activity Tolerance: Patient limited by pain Patient left: in bed;with call bell/phone within reach;with bed alarm set     Time: 1415-1440 PT Time Calculation (min) (ACUTE ONLY): 25 min  Charges:  $Therapeutic Exercise: 23-37 mins                    G Codes:      Ramond Dial 12-19-2014, 3:41 PM  Mee Hives, PT MS Acute Rehab Dept. Number: [phone]

## 2014-12-07 LAB — CULTURE, GROUP A STREP: STREP A CULTURE: NEGATIVE

## 2014-12-07 MED ORDER — HYDROCODONE-ACETAMINOPHEN 5-325 MG PO TABS: 1.0000 | ORAL_TABLET | ORAL | Status: DC | PRN

## 2014-12-07 MED ORDER — ENSURE COMPLETE PO LIQD: 237.0000 mL | Freq: Two times a day (BID) | ORAL | Status: AC

## 2014-12-07 NOTE — Progress Notes (Signed)
PROGRESS NOTE  Kaitlin Rodriguez FTD:[id number] DOB: 1928/09/20 DOA: 12/04/2014 PCP: Reymundo Poll, MD  HPI: Kaitlin Rodriguez is an 81 year old female with past medical history of Alzheimer's dementia, renal carcinoma, HTN, and hyperlipidemia who presented to the [facility] ED on 12/04/14 following an unwitnessed fall at her assisted living facility. Patient is unable to provide many details due to dementia. In the ED, right hip x-ray shows minimally displaced greater trochanter fracture adjacent to previous arthroplasty. Orthopedics consulted (Dr. Len Childs) and determined to be non-surgical and arranged follow up once patient is discharged.  UA positive for moderate leukocytes and many bacteria so started on Rocephin. Patient had mild leukocytosis with WBC 10.6. Creatinine was increased to 1.30.  Subjective - feeling well overall, right shoulder pain - confused but pleasant  Assessment/Plan:  UTI (lower urinary tract infection) - Patient presented with dysuria and signs of infection on UA - Started on Rocephin, urine cultures negative, no need for antibiotics on d/c - Patient is afebrile (98.6) and has a normal white count (10.0) Closed right hip fracture - Presented with minimally displaced fracture of the greater trochanter as seen on x-ray - EDP discussed with Dr. Len Childs of [facility] orthopedics who determined was non-surgical case and arranged outpatient follow up - Physical therapy evaluation ordered, will need higher care than assisted living, SW consult - discussed with Dr. Veverly Fells office and confirmed that this is non-operative, non weight bearing Right shoulder pain - XR with chronic rotator cuff injury without acute findings. Outpatient orthopedic follow up.  Alzheimer's dementia - Patient has documented history of Alzheimer's dementia  - Not currently on any medication Sore throat  - rapid strep negative   Code Status: DNR Family Communication: none today  Disposition Plan: SNF  Sunday    Consultants:  Social work  Physical therapy   Orthopedics   Procedures:  None   Antibiotics: Ceftriaxone 3/9 >>  Objective: Filed Vitals:   12/06/14 0616 12/06/14 1308 12/06/14 2112 12/07/14 0457  BP: 126/36 122/56 125/38 132/98  Pulse: 70 76 74 75  Temp: 98.5 F (36.9 C) 98.7 F (37.1 C) 98.7 F (37.1 C) 98.7 F (37.1 C)  TempSrc: Oral Oral Oral Oral  Resp: 19 18 19 19   Height:      Weight:      SpO2: 100% 100% 100% 100%    Intake/Output Summary (Last 24 hours) at 12/07/14 1227 Last data filed at 12/07/14 1126  Gross per 24 hour  Intake    460 ml  Output    350 ml  Net    110 ml   Filed Weights   12/04/14 1502  Weight: 44.453 kg (98 lb)   Exam: General: Well developed, well nourished, NAD, appears stated age  24:  Anicteic Sclera Neck: Supple Cardiovascular: RRR, S1 S2 auscultated, no rubs, murmurs or gallops.   Respiratory: Clear to auscultation bilaterally with equal chest rise  Abdomen: Soft, nontender, nondistended, + bowel sounds  Extremities: warm dry without cyanosis clubbing or edema.  Neuro: Alert, Oriented to person, but not place and time. Strength 5/5 on left upper and lower extremity.  Skin: Without rashes exudates or nodules.    Data Reviewed: Basic Metabolic Panel:  Recent Labs Lab 12/04/14 0830 12/04/14 1926 12/05/14 0630  NA 135  --  141  K 3.5  --  3.6  CL 109  --  108  CO2 24  --  25  GLUCOSE 116*  --  96  BUN 35*  --  26*  CREATININE 1.11* 1.30* 1.02  CALCIUM 8.5  --  8.5   Liver Function Tests:  Recent Labs Lab 12/04/14 0830  AST 25  ALT 12  ALKPHOS 46  BILITOT 0.5  PROT 6.6  ALBUMIN 3.2*   CBC:  Recent Labs Lab 12/04/14 0830 12/04/14 1926 12/05/14 0630  WBC 10.6* 10.9* 10.0  NEUTROABS 8.5*  --   --   HGB 11.7* 11.8* 11.0*  HCT 34.9* 35.7* 33.2*  MCV 94.6 93.5 95.1  PLT 353 357 322   Cardiac Enzymes:  Recent Labs Lab 12/04/14 1926  TROPONINI <0.03   Studies: No results  found.  Scheduled Meds: . atorvastatin  40 mg Oral Daily  . cefTRIAXone (ROCEPHIN)  IV  1 g Intravenous Q24H  . docusate sodium  100 mg Oral BID  . donepezil  5 mg Oral QHS  . feeding supplement (ENSURE COMPLETE)  237 mL Oral BID BM  . heparin subcutaneous  5,000 Units Subcutaneous 3 times per day  . polyethylene glycol  17 g Oral Daily   Continuous Infusions:    Active Problems:   UTI (lower urinary tract infection)   Closed right hip fracture   Alzheimer's dementia  Kaitlin Nodal M. Cruzita Lederer, MD [facility] [phone] 12/07/2014, 12:27 PM

## 2014-12-07 NOTE — Clinical Social Work Note (Signed)
CSW continues to follow this patient. CSW spoke with MD on this date regarding Sunday d/c. CSW faxed d/c summary to facility and confirmed bed availability for Sunday, 3/13 with [facility]. CSW to follow-up with RN Sup. [phone] regarding d/c on Sunday.   [place], [place] Weekend Clinical Social Worker [phone]

## 2014-12-07 NOTE — Discharge Summary (Signed)
Physician Discharge Summary  RALPH BENAVIDEZ FUX:[id number] DOB: 03-14-1928 DOA: 12/04/2014  PCP: Reymundo Poll, MD  Admit date: 12/04/2014 Discharge date: 12/08/2014  Time spent: 35 minutes  Recommendations for Outpatient Follow-up:  1. Follow up with Dr. Fredderick Phenix in 2-4 weeks 2. Follow up with Dr. Len Childs from orthopedic surgery in 1 week   Discharge Diagnoses:  Active Problems:   UTI (lower urinary tract infection)   Closed right hip fracture   Alzheimer's dementia   Discharge Condition: stable  Diet recommendation: regular  Filed Weights   12/04/14 1502  Weight: 44.453 kg (98 lb)    History of present illness:  Kaitlin Rodriguez is a 81 y.o. female, with a past medical history significant for Alzheimer's dementia, renal carcinoma, pancreatitis, hypertension, hyperlipidemia. Kaitlin Rodriguez is a very poor historian. She presented to the [facility] from [facility] for right hip pain. Reportedly there was no witnessed fall. X-rays of her right hip show a minimally displaced right greater trochanter fracture adjacent to a previous right hip arthroplasty. The EDP discussed the case with Dr. Len Childs of [facility] orthopedics who determined this was a non-surgical case. He recommended partial weightbearing status and follow-up in his office in one week. Kaitlin Rodriguez UA showed urinary tract infection. Labs show mild dehydration.  Hospital Course:  UTI (lower urinary tract infection) - Patient presented with dysuria and signs of infection on UA - Started on Rocephin, urine cultures negative, already treated for 4 days in hospital no need for antibiotics on d/c - Patient is afebrile and has a normal white count Closed right hip fracture - Presented with minimally displaced fracture of the greater trochanter as seen on x-ray - EDP discussed with Dr. Len Childs of [facility] orthopedics who determined was non-surgical case and arranged outpatient follow up - Physical therapy evaluation  ordered, will need higher care than assisted living, SW consult - discussed with our orthopedic surgeon Dr. Veverly Fells office and confirmed that this is non-operative, non weight bearing Right shoulder pain - XR with chronic rotator cuff injury without acute findings. Outpatient orthopedic follow up.  Alzheimer's dementia - Patient has documented history of Alzheimer's dementia  Sore throat  - rapid strep negative  Procedures:  None    Consultations:  Orthopedic surgery - phone  Discharge Exam: Filed Vitals:   12/07/14 0457 12/07/14 1332 12/07/14 2057 12/08/14 0615  BP: 132/98 128/62 137/50 132/52  Pulse: 75 78 62 66  Temp: 98.7 F (37.1 C) 98.4 F (36.9 C) 98.3 F (36.8 C) 98 F (36.7 C)  TempSrc: Oral Oral Oral Oral  Resp: 19 18 17 17   Height:      Weight:      SpO2: 100% 100% 100% 100%    General: NAD Cardiovascular: RRR Respiratory: CTA biL  Discharge Instructions     Medication List    STOP taking these medications        traMADol 50 MG tablet  Commonly known as:  ULTRAM      TAKE these medications        acetaminophen 325 MG tablet  Commonly known as:  TYLENOL  Take 650 mg by mouth every 8 (eight) hours as needed for moderate pain.     atorvastatin 40 MG tablet  Commonly known as:  LIPITOR  Take 40 mg by mouth at bedtime.     docusate sodium 100 MG capsule  Commonly known as:  COLACE  Take 100 mg by mouth 2 (two) times daily.  donepezil 10 MG tablet  Commonly known as:  ARICEPT  Take 10 mg by mouth 2 (two) times daily.     feeding supplement (ENSURE COMPLETE) Liqd  Take 237 mLs by mouth 2 (two) times daily between meals.     HYDROcodone-acetaminophen 5-325 MG per tablet  Commonly known as:  NORCO/VICODIN  Take 1-2 tablets by mouth every 4 (four) hours as needed for moderate pain.     hydrocortisone 1 % Crea  Commonly known as:  PROCTOCORT  Place 1 application rectally daily as needed (for rectum).     ondansetron 4 MG tablet    Commonly known as:  ZOFRAN  Take 4 mg by mouth as needed for nausea.     polyethylene glycol packet  Commonly known as:  MIRALAX / GLYCOLAX  Take 17 g by mouth daily.         The results of significant diagnostics from this hospitalization (including imaging, microbiology, ancillary and laboratory) are listed below for reference.    Significant Diagnostic Studies: Dg Shoulder Right Port  2014-12-28   CLINICAL DATA:  Right shoulder pain, fall  EXAM: PORTABLE RIGHT SHOULDER - 2+ VIEW  COMPARISON:  08/27/2014  FINDINGS: Moderate acromioclavicular joint hypertrophy. No fracture or acute dislocation. Elevation of the humerus relative to the glenoid stable from prior study most consistent with chronic rotator cuff tear. Mild degenerative change at the greater tuberosity at cuff tendon insertion site.  IMPRESSION: Chronic rotator cuff tear with elevation of the humerus similar to prior study. No evidence of fracture or other acute traumatic abnormality.   Electronically Signed   By: Skipper Cliche M.D.   On: 12-28-14 10:20   Dg Hip Unilat With Pelvis 2-3 Views Right  12/04/2014   CLINICAL DATA:  Right hip pain starting this morning, decreased range of motion, denies injury, initial encounter.  EXAM: RIGHT HIP (WITH PELVIS) 2-3 VIEWS  COMPARISON:  Sagittal and coronal reformatted images CT abdomen pelvis 07/22/2014.  FINDINGS: Bilateral hip arthroplasties without dislocation. There is a new displaced fracture at the base of the right greater trochanter. Obturator rings are intact.  IMPRESSION: Minimally displaced right greater trochanter fracture adjacent to a right hip arthroplasty.   Electronically Signed   By: Lorin Picket M.D.   On: 12/04/2014 08:21    Microbiology: Recent Results (from the past 240 hour(s))  Urine culture     Status: None   Collection Time: 12/04/14 10:45 AM  Result Value Ref Range Status   Specimen Description URINE, CATHETERIZED  Final   Special Requests NONE  Final    Colony Count NO GROWTH Performed at [facility]   Final   Culture NO GROWTH Performed at [facility]   Final   Report Status 12/06/2014 FINAL  Final  Rapid strep screen     Status: None   Collection Time: 12-28-2014  1:50 PM  Result Value Ref Range Status   Streptococcus, Group A Screen (Direct) NEGATIVE NEGATIVE Final    Comment: (NOTE) A Rapid Antigen test may result negative if the antigen level in the sample is below the detection level of this test. The FDA has not cleared this test as a stand-alone test therefore the rapid antigen negative result has reflexed to a Group A Strep culture.   Culture, Group A Strep     Status: None   Collection Time: 12-28-14  1:50 PM  Result Value Ref Range Status   Strep A Culture Negative  Final    Comment: (NOTE)  Performed At: [facility] [place], [place] [id number] Lindon Romp MD WP:[id number]      Labs: Basic Metabolic Panel:  Recent Labs Lab 12/04/14 0830 12/04/14 1926 12/05/14 0630  NA 135  --  141  K 3.5  --  3.6  CL 109  --  108  CO2 24  --  25  GLUCOSE 116*  --  96  BUN 35*  --  26*  CREATININE 1.11* 1.30* 1.02  CALCIUM 8.5  --  8.5   Liver Function Tests:  Recent Labs Lab 12/04/14 0830  AST 25  ALT 12  ALKPHOS 46  BILITOT 0.5  PROT 6.6  ALBUMIN 3.2*   CBC:  Recent Labs Lab 12/04/14 0830 12/04/14 1926 12/05/14 0630  WBC 10.6* 10.9* 10.0  NEUTROABS 8.5*  --   --   HGB 11.7* 11.8* 11.0*  HCT 34.9* 35.7* 33.2*  MCV 94.6 93.5 95.1  PLT 353 357 322   Cardiac Enzymes:  Recent Labs Lab 12/04/14 1926  TROPONINI <0.03   Signed:  Marzetta Board  [facility] 12/08/2014, 8:31 AM

## 2014-12-08 NOTE — Progress Notes (Signed)
Report called to Lavella Lemons at [facility], Patient discharged today via ambulance. Discharge packet given to transporter. No further questions at this moment. Patient agreed and verbalized understanding.  Vergia Alberts n 11:15 AM 12/08/2014

## 2014-12-08 NOTE — Progress Notes (Signed)
Let daughter know about pt transfer.

## 2014-12-08 NOTE — Clinical Social Work Note (Signed)
CSW made aware patient ready for d/c to [facility]. CSW contacted facility and spoke with RN Sup who confirmed bed availability. CSW faxed updated d/c summary to facility. CSW contacted patient's daughter Sherron Monday and made her aware of d/c plan. [facility] is agreeable to plan and transportation via EMS. CSW prepared d/c packet and placed in patient's shadow chart. CSW provided RN with number for report. CSW to arrange transportation via [facility]. No further needs. CSW signing off.  [place], [place] Weekend Clinical Social Worker [phone]

## 2015-06-07 ENCOUNTER — Encounter (HOSPITAL_BASED_OUTPATIENT_CLINIC_OR_DEPARTMENT_OTHER): Payer: Self-pay

## 2015-06-07 ENCOUNTER — Inpatient Hospital Stay (HOSPITAL_BASED_OUTPATIENT_CLINIC_OR_DEPARTMENT_OTHER)
Admission: EM | Admit: 2015-06-07 | Discharge: 2015-06-11 | DRG: 536 | Disposition: A | Discharge status: Skilled Nursing Facility | Payer: Medicare Other | Attending: Internal Medicine | Admitting: Internal Medicine

## 2015-06-07 ENCOUNTER — Emergency Department (HOSPITAL_BASED_OUTPATIENT_CLINIC_OR_DEPARTMENT_OTHER): Payer: Medicare Other

## 2015-06-07 DIAGNOSIS — R319 Hematuria, unspecified: Secondary | ICD-10-CM | POA: Diagnosis present

## 2015-06-07 DIAGNOSIS — Z882 Allergy status to sulfonamides status: Secondary | ICD-10-CM | POA: Diagnosis not present

## 2015-06-07 DIAGNOSIS — Z905 Acquired absence of kidney: Secondary | ICD-10-CM | POA: Diagnosis not present

## 2015-06-07 DIAGNOSIS — S32511A Fracture of superior rim of right pubis, initial encounter for closed fracture: Secondary | ICD-10-CM | POA: Diagnosis not present

## 2015-06-07 DIAGNOSIS — Z85528 Personal history of other malignant neoplasm of kidney: Secondary | ICD-10-CM | POA: Diagnosis not present

## 2015-06-07 DIAGNOSIS — N390 Urinary tract infection, site not specified: Secondary | ICD-10-CM | POA: Diagnosis present

## 2015-06-07 DIAGNOSIS — Z96643 Presence of artificial hip joint, bilateral: Secondary | ICD-10-CM | POA: Diagnosis not present

## 2015-06-07 DIAGNOSIS — W19XXXA Unspecified fall, initial encounter: Secondary | ICD-10-CM | POA: Diagnosis present

## 2015-06-07 DIAGNOSIS — S32591A Other specified fracture of right pubis, initial encounter for closed fracture: Principal | ICD-10-CM | POA: Diagnosis present

## 2015-06-07 DIAGNOSIS — B952 Enterococcus as the cause of diseases classified elsewhere: Secondary | ICD-10-CM | POA: Diagnosis present

## 2015-06-07 DIAGNOSIS — T148XXA Other injury of unspecified body region, initial encounter: Secondary | ICD-10-CM

## 2015-06-07 DIAGNOSIS — F0280 Dementia in other diseases classified elsewhere without behavioral disturbance: Secondary | ICD-10-CM | POA: Diagnosis present

## 2015-06-07 DIAGNOSIS — E785 Hyperlipidemia, unspecified: Secondary | ICD-10-CM | POA: Diagnosis present

## 2015-06-07 DIAGNOSIS — S0083XA Contusion of other part of head, initial encounter: Secondary | ICD-10-CM | POA: Diagnosis not present

## 2015-06-07 DIAGNOSIS — G309 Alzheimer's disease, unspecified: Secondary | ICD-10-CM | POA: Diagnosis not present

## 2015-06-07 DIAGNOSIS — I10 Essential (primary) hypertension: Secondary | ICD-10-CM | POA: Diagnosis present

## 2015-06-07 DIAGNOSIS — Y92129 Unspecified place in nursing home as the place of occurrence of the external cause: Secondary | ICD-10-CM | POA: Diagnosis not present

## 2015-06-07 DIAGNOSIS — S72001A Fracture of unspecified part of neck of right femur, initial encounter for closed fracture: Secondary | ICD-10-CM | POA: Diagnosis present

## 2015-06-07 DIAGNOSIS — S0003XA Contusion of scalp, initial encounter: Secondary | ICD-10-CM

## 2015-06-07 DIAGNOSIS — S0181XA Laceration without foreign body of other part of head, initial encounter: Secondary | ICD-10-CM | POA: Diagnosis not present

## 2015-06-07 LAB — URINALYSIS, ROUTINE W REFLEX MICROSCOPIC
Bilirubin Urine: NEGATIVE
GLUCOSE, UA: NEGATIVE mg/dL
Ketones, ur: NEGATIVE mg/dL
Nitrite: NEGATIVE
Protein, ur: 100 mg/dL — AB
Specific Gravity, Urine: 1.015 (ref 1.005–1.030)
Urobilinogen, UA: 1 mg/dL (ref 0.0–1.0)
pH: 7.5 (ref 5.0–8.0)

## 2015-06-07 LAB — COMPREHENSIVE METABOLIC PANEL
ALBUMIN: 2.9 g/dL — AB (ref 3.5–5.0)
ALK PHOS: 47 U/L (ref 38–126)
ALT: 14 U/L (ref 14–54)
ANION GAP: 10 (ref 5–15)
AST: 26 U/L (ref 15–41)
BUN: 21 mg/dL — AB (ref 6–20)
CALCIUM: 8.8 mg/dL — AB (ref 8.9–10.3)
CO2: 22 mmol/L (ref 22–32)
Chloride: 106 mmol/L (ref 101–111)
Creatinine, Ser: 1.02 mg/dL — ABNORMAL HIGH (ref 0.44–1.00)
GFR calc Af Amer: 56 mL/min — ABNORMAL LOW (ref >60)
GFR calc non Af Amer: 49 mL/min — ABNORMAL LOW (ref >60)
GLUCOSE: 146 mg/dL — AB (ref 65–99)
Potassium: 4 mmol/L (ref 3.5–5.1)
SODIUM: 138 mmol/L (ref 135–145)
Total Bilirubin: 0.7 mg/dL (ref 0.3–1.2)
Total Protein: 6.7 g/dL (ref 6.5–8.1)

## 2015-06-07 LAB — PROTIME-INR
INR: 1.2 (ref 0.00–1.49)
Prothrombin Time: 15.4 seconds — ABNORMAL HIGH (ref 11.6–15.2)

## 2015-06-07 LAB — CBC WITH DIFFERENTIAL/PLATELET
BASOS ABS: 0 10*3/uL (ref 0.0–0.1)
Basophils Absolute: 0 10*3/uL (ref 0.0–0.1)
Basophils Relative: 0 % (ref 0–1)
Basophils Relative: 0 % (ref 0–1)
EOS PCT: 0 % (ref 0–5)
EOS PCT: 0 % (ref 0–5)
Eosinophils Absolute: 0 10*3/uL (ref 0.0–0.7)
Eosinophils Absolute: 0 10*3/uL (ref 0.0–0.7)
HCT: 35.7 % — ABNORMAL LOW (ref 36.0–46.0)
HEMATOCRIT: 34.5 % — AB (ref 36.0–46.0)
HEMOGLOBIN: 11.8 g/dL — AB (ref 12.0–15.0)
Hemoglobin: 11.9 g/dL — ABNORMAL LOW (ref 12.0–15.0)
LYMPHS ABS: 1.2 10*3/uL (ref 0.7–4.0)
LYMPHS ABS: 2.1 10*3/uL (ref 0.7–4.0)
LYMPHS PCT: 18 % (ref 12–46)
Lymphocytes Relative: 9 % — ABNORMAL LOW (ref 12–46)
MCH: 31.8 pg (ref 26.0–34.0)
MCH: 32.5 pg (ref 26.0–34.0)
MCHC: 33.3 g/dL (ref 30.0–36.0)
MCHC: 34.2 g/dL (ref 30.0–36.0)
MCV: 95.5 fL (ref 78.0–100.0)
MCV: 95 fL (ref 78.0–100.0)
MONO ABS: 0.9 10*3/uL (ref 0.1–1.0)
Monocytes Absolute: 0.7 10*3/uL (ref 0.1–1.0)
Monocytes Relative: 7 % (ref 3–12)
Monocytes Relative: 6 % (ref 3–12)
NEUTROS ABS: 8.6 10*3/uL — AB (ref 1.7–7.7)
NEUTROS PCT: 76 % (ref 43–77)
Neutro Abs: 10.6 10*3/uL — ABNORMAL HIGH (ref 1.7–7.7)
Neutrophils Relative %: 84 % — ABNORMAL HIGH (ref 43–77)
PLATELETS: 280 10*3/uL (ref 150–400)
Platelets: 292 10*3/uL (ref 150–400)
RBC: 3.74 MIL/uL — ABNORMAL LOW (ref 3.87–5.11)
RBC: 3.63 MIL/uL — AB (ref 3.87–5.11)
RDW: 13.5 % (ref 11.5–15.5)
RDW: 14.1 % (ref 11.5–15.5)
WBC: 12.7 10*3/uL — ABNORMAL HIGH (ref 4.0–10.5)
WBC: 11.3 10*3/uL — AB (ref 4.0–10.5)

## 2015-06-07 LAB — URINE MICROSCOPIC-ADD ON

## 2015-06-07 LAB — BASIC METABOLIC PANEL
Anion gap: 10 (ref 5–15)
BUN: 27 mg/dL — AB (ref 6–20)
CHLORIDE: 105 mmol/L (ref 101–111)
CO2: 24 mmol/L (ref 22–32)
CREATININE: 0.96 mg/dL (ref 0.44–1.00)
Calcium: 9.2 mg/dL (ref 8.9–10.3)
GFR calc Af Amer: >60 mL/min (ref >60)
GFR, EST NON AFRICAN AMERICAN: 52 mL/min — AB (ref >60)
Glucose, Bld: 111 mg/dL — ABNORMAL HIGH (ref 65–99)
Potassium: 3.8 mmol/L (ref 3.5–5.1)
Sodium: 139 mmol/L (ref 135–145)

## 2015-06-07 MED ORDER — HALOPERIDOL 5 MG PO TABS
ORAL_TABLET | ORAL | Status: AC
  Filled 2015-06-07: qty 1

## 2015-06-07 MED ORDER — ONDANSETRON HCL 4 MG/2ML IJ SOLN
4.0000 mg | Freq: Three times a day (TID) | INTRAMUSCULAR | Status: AC | PRN
  Administered 2015-06-07: 4 mg via INTRAVENOUS
  Filled 2015-06-07: qty 2

## 2015-06-07 MED ORDER — MORPHINE SULFATE (PF) 2 MG/ML IV SOLN
2.0000 mg | Freq: Once | INTRAVENOUS | Status: AC
  Administered 2015-06-07: 2 mg via INTRAVENOUS
  Filled 2015-06-07: qty 1

## 2015-06-07 MED ORDER — CEFTRIAXONE SODIUM 1 G IJ SOLR
INTRAMUSCULAR | Status: AC
  Administered 2015-06-07: 1000 mg
  Filled 2015-06-07: qty 10

## 2015-06-07 MED ORDER — ENOXAPARIN SODIUM 30 MG/0.3ML ~~LOC~~ SOLN: 30.0000 mg | SUBCUTANEOUS | Status: DC

## 2015-06-07 MED ORDER — ENSURE ENLIVE PO LIQD
237.0000 mL | Freq: Two times a day (BID) | ORAL | Status: DC
  Administered 2015-06-09 – 2015-06-11 (×6): 237 mL via ORAL
  Filled 2015-06-07 (×2): qty 237

## 2015-06-07 MED ORDER — HALOPERIDOL 5 MG PO TABS
5.0000 mg | ORAL_TABLET | Freq: Once | ORAL | Status: AC
  Administered 2015-06-07: 5 mg via ORAL

## 2015-06-07 MED ORDER — HALOPERIDOL 2 MG PO TABS
2.0000 mg | ORAL_TABLET | Freq: Once | ORAL | Status: DC
  Filled 2015-06-07: qty 1

## 2015-06-07 MED ORDER — DEXTROSE 5 % IV SOLN: 1.0000 g | Freq: Once | INTRAVENOUS | Status: AC

## 2015-06-07 MED ORDER — VITAMIN D 1000 UNITS PO TABS
1000.0000 [IU] | ORAL_TABLET | Freq: Every day | ORAL | Status: DC
  Administered 2015-06-09 – 2015-06-11 (×3): 1000 [IU] via ORAL
  Filled 2015-06-07 (×4): qty 1

## 2015-06-07 MED ORDER — ATORVASTATIN CALCIUM 40 MG PO TABS
40.0000 mg | ORAL_TABLET | Freq: Every day | ORAL | Status: DC
  Administered 2015-06-07 – 2015-06-10 (×4): 40 mg via ORAL
  Filled 2015-06-07: qty 4
  Filled 2015-06-07: qty 1
  Filled 2015-06-07 (×2): qty 4

## 2015-06-07 MED ORDER — DOCUSATE SODIUM 100 MG PO CAPS
100.0000 mg | ORAL_CAPSULE | Freq: Two times a day (BID) | ORAL | Status: DC
  Administered 2015-06-07 – 2015-06-11 (×7): 100 mg via ORAL
  Filled 2015-06-07 (×8): qty 1

## 2015-06-07 MED ORDER — SODIUM CHLORIDE 0.9 % IV SOLN
Freq: Once | INTRAVENOUS | Status: AC
  Administered 2015-06-07: 999 mL via INTRAVENOUS

## 2015-06-07 MED ORDER — TETANUS-DIPHTH-ACELL PERTUSSIS 5-2.5-18.5 LF-MCG/0.5 IM SUSP
0.5000 mL | Freq: Once | INTRAMUSCULAR | Status: AC
  Administered 2015-06-07: 0.5 mL via INTRAMUSCULAR
  Filled 2015-06-07: qty 0.5

## 2015-06-07 MED ORDER — DONEPEZIL HCL 10 MG PO TABS
10.0000 mg | ORAL_TABLET | Freq: Every day | ORAL | Status: DC
  Administered 2015-06-07 – 2015-06-10 (×4): 10 mg via ORAL
  Filled 2015-06-07 (×4): qty 1

## 2015-06-07 MED ORDER — MORPHINE SULFATE (PF) 2 MG/ML IV SOLN
2.0000 mg | INTRAVENOUS | Status: DC | PRN
  Administered 2015-06-07: 2 mg via INTRAVENOUS
  Filled 2015-06-07: qty 1

## 2015-06-07 MED ORDER — MORPHINE SULFATE (PF) 2 MG/ML IV SOLN
1.0000 mg | INTRAVENOUS | Status: DC | PRN
  Administered 2015-06-08: 1 mg via INTRAVENOUS
  Filled 2015-06-07: qty 1

## 2015-06-07 NOTE — ED Notes (Signed)
Per EMS pt from [facility], states fell going to the bathroom, c/o rt hip pain without shortening or rotation, has a lac to rt temple with bleeding controlled

## 2015-06-07 NOTE — ED Notes (Signed)
Patient transported to CT 

## 2015-06-07 NOTE — ED Notes (Signed)
Per [facility] facility they notified patients daughter [name] who resides in [place] and patient injury and diagnosis.

## 2015-06-07 NOTE — ED Notes (Signed)
Attempted PIV x 2 unsuccessful 

## 2015-06-07 NOTE — ED Notes (Signed)
Report given to [place] on [place]. All questions answered. Discussed the information provide from the patient's daughter /

## 2015-06-07 NOTE — ED Provider Notes (Signed)
CSN: [id number]     Arrival date & time 06/07/15  0548 History   First MD Initiated Contact with Patient 06/07/15 [phone]     Chief Complaint  Patient presents with  . Fall     (Consider location/radiation/quality/duration/timing/severity/associated sxs/prior Treatment) HPI 81 year old female who presents with fall. History of Alzheimer's dementia from nursing facility.  According to the nursing facility, patient had a fall while ambulating to the restroom. She had hit her head against the corner of her bed. She was found on the ground by nursing staff this morning. Patient is unable to recall incident. Reports primarily right hip pain. Denies abdominal pain, difficulty breathing, lightheadedness, chest pain, neck pain, headache, numbness or weakness.    Past Medical History  Diagnosis Date  . Hypertension   . Hyperlipidemia   . Complication of anesthesia     "didn't wake up for a long time, pale, hypotension; got psychotic after one of her hip ORs in ~ 2010"  . Positive TB test     "father passed away from TB; she's negative/CXR"  . History of blood transfusion 2004    "related to hip replacement"  . Blood type, Rh negative   . Osteoarthritis     "severe in her hands" (12/04/2014)  . Alzheimer's dementia     "50% loss"  . Cancer of kidney   . Acute pancreatitis hospitalized 07/2014   Past Surgical History  Procedure Laterality Date  . Thyroid surgery  1960's    "took out tumor"  . Joint replacement    . Total hip arthroplasty Bilateral 2004-2010  . Abdominal hysterectomy    . Nephrectomy  ~ 2011    "cancer; it was contained"  . Tonsillectomy    . Breast lumpectomy      "benign"  . Cataract extraction w/ intraocular lens implant      "? side"   History reviewed. No pertinent family history. Social History  Substance Use Topics  . Smoking status: Never Smoker   . Smokeless tobacco: Never Used  . Alcohol Use: No   OB History    No data available     Review of  Systems 10/14 systems reviewed and are negative other than those stated in the HPI  Allergies  Oxybutynin and Sulfa antibiotics  Home Medications   Prior to Admission medications   Medication Sig Start Date End Date Taking? Authorizing Provider  acetaminophen (TYLENOL) 325 MG tablet Take 650 mg by mouth every 8 (eight) hours as needed for moderate pain.     Historical Provider, MD  atorvastatin (LIPITOR) 40 MG tablet Take 40 mg by mouth at bedtime.     Historical Provider, MD  docusate sodium (COLACE) 100 MG capsule Take 100 mg by mouth 2 (two) times daily.    Historical Provider, MD  donepezil (ARICEPT) 10 MG tablet Take 10 mg by mouth 2 (two) times daily.    Historical Provider, MD  feeding supplement, ENSURE COMPLETE, (ENSURE COMPLETE) LIQD Take 237 mLs by mouth 2 (two) times daily between meals. 12/07/14   Costin Karlyne Greenspan, MD  HYDROcodone-acetaminophen (NORCO/VICODIN) 5-325 MG per tablet Take 1-2 tablets by mouth every 4 (four) hours as needed for moderate pain. 12/07/14   Costin Karlyne Greenspan, MD  hydrocortisone (PROCTOCORT) 1 % CREA Place 1 application rectally daily as needed (for rectum).    Historical Provider, MD  ondansetron (ZOFRAN) 4 MG tablet Take 4 mg by mouth as needed for nausea.    Historical Provider, MD  polyethylene glycol (MIRALAX / GLYCOLAX) packet Take 17 g by mouth daily.    Historical Provider, MD   BP 95/64 mmHg  Pulse 119  Temp(Src) 99 F (37.2 C) (Oral)  Resp 16  Ht 5' 1.5" (1.562 m)  Wt 100 lb (45.36 kg)  BMI 18.59 kg/m2  SpO2 79% Physical Exam Physical Exam  Nursing note and vitals reviewed. Constitutional: Elderly, well developed, well nourished, non-toxic, and in no acute distress Head: Normocephalic. 1.5 cm superficial laceration to the right temple with bleeding well controlled. Mouth/Throat: Oropharynx is clear and moist.  Neck: Normal range of motion. Neck supple. Upper mild cervical spine tenderness without stepoffs or malalignment. Cardiovascular:  Normal rate and regular rhythm.  +2 dp and radial pulses bilaterally.  Pulmonary/Chest: Effort normal and breath sounds normal. Mild chest wall tenderness. No crepitus or deformities.  Abdominal: Soft. There is no tenderness. There is no rebound and no guarding.  Musculoskeletal: Limited ROM of right hip due to pain without appreciable deformity.  Neurological: Alert, no facial droop, fluent speech, moves all extremities symmetrically, sensation to light touch in tact in all four extremities Skin: Skin is warm and dry.  Psychiatric: Cooperative  ED Course  Procedures (including critical care time) Labs Review Labs Reviewed  CBC WITH DIFFERENTIAL/PLATELET - Abnormal; Notable for the following:    WBC 12.7 (*)    RBC 3.74 (*)    Hemoglobin 11.9 (*)    HCT 35.7 (*)    Neutrophils Relative % 84 (*)    Neutro Abs 10.6 (*)    Lymphocytes Relative 9 (*)    All other components within normal limits  BASIC METABOLIC PANEL - Abnormal; Notable for the following:    Glucose, Bld 111 (*)    BUN 27 (*)    GFR calc non Af Amer 52 (*)    All other components within normal limits  URINALYSIS, ROUTINE W REFLEX MICROSCOPIC (NOT AT [facility]) - Abnormal; Notable for the following:    Color, Urine AMBER (*)    APPearance CLOUDY (*)    Hgb urine dipstick LARGE (*)    Protein, ur 100 (*)    Leukocytes, UA MODERATE (*)    All other components within normal limits  URINE MICROSCOPIC-ADD ON - Abnormal; Notable for the following:    Bacteria, UA MANY (*)    All other components within normal limits  URINE CULTURE    Imaging Review Dg Chest 1 View  06/07/2015   CLINICAL DATA:  Fall  EXAM: CHEST  1 VIEW  COMPARISON:  08/27/2014  FINDINGS: The heart size and mediastinal contours are within normal limits. Both lungs are clear but hyperinflated which may suggest emphysema. The visualized skeletal structures are unremarkable.  IMPRESSION: No active disease.   Electronically Signed   By: Conchita Paris M.D.   On:  06/07/2015 08:16   Dg Lumbar Spine Complete  06/07/2015   CLINICAL DATA:  81 year old female with a history of fall. Right hip pain.  EXAM: LUMBAR SPINE - COMPLETE 4+ VIEW  COMPARISON:  None.  FINDINGS: Anterior view demonstrates mild right apex scoliotic curvature secondary to degenerative changes.  Vertebral bodies relatively aligned with no subluxation. Subtle anterolisthesis of L5 on S1. Subtle retrolisthesis of L3 on L4.  Vertebral body heights maintained.  No acute fracture line identified.  Osteopenia.  Degenerative disc disease is worst at the L4-L5 and L5-S1 levels.  Oblique images demonstrate no displaced pars defect.  Calcifications of the vasculature.  Surgical changes of the right  abdomen.  Surgical changes of bilateral total hip arthroplasty.  IMPRESSION: No radiographic evidence of acute fracture or malalignment of the lumbar spine.  Atherosclerosis.  Surgical changes of the abdomen and of bilateral hip total arthroplasty.  Signed,  Dulcy Fanny. Earleen Newport, DO  [facility]  [facility]   Electronically Signed   By: Corrie Mckusick D.O.   On: 06/07/2015 08:17   Ct Head Wo Contrast  06/07/2015   CLINICAL DATA:  Fall with laceration to the right eye.  Confusion.  EXAM: CT HEAD WITHOUT CONTRAST  CT CERVICAL SPINE WITHOUT CONTRAST  TECHNIQUE: Multidetector CT imaging of the head and cervical spine was performed following the standard protocol without intravenous contrast. Multiplanar CT image reconstructions of the cervical spine were also generated.  COMPARISON:  Head CT 08/27/2014  FINDINGS: CT HEAD FINDINGS  Motion degraded study.  Skull and Sinuses:There is soft tissue swelling and hematoma over the right scalp without underlying fracture.  Orbits: No traumatic finding  Brain: No evidence of acute infarction, hemorrhage, hydrocephalus, or mass lesion/mass effect.  There is generalized cortical atrophy which is stable from 2015. Extensive chronic small vessel  disease with ischemic gliosis throughout the bilateral cerebral white matter. Chronic remote left occipital cortex infarction.  CT CERVICAL SPINE FINDINGS  No evidence of acute fracture or traumatic malalignment. Mild C3-4 and C4-5 anterolisthesis is chronic based on brain MRI 02/21/2014.  Diffuse degenerative disc and facet disease, which accounts for the above anterolisthesis. Disc narrowing is advanced in the lower cervical spine, especially at C5-6 and C6-7.  IMPRESSION: 1. Motion degraded study without acute intracranial or cervical spine finding. 2. Right frontal scalp hematoma without fracture. 3. Atrophy and extensive chronic small vessel disease.   Electronically Signed   By: Monte Fantasia M.D.   On: 06/07/2015 08:04   Ct Cervical Spine Wo Contrast  06/07/2015   CLINICAL DATA:  Fall with laceration to the right eye.  Confusion.  EXAM: CT HEAD WITHOUT CONTRAST  CT CERVICAL SPINE WITHOUT CONTRAST  TECHNIQUE: Multidetector CT imaging of the head and cervical spine was performed following the standard protocol without intravenous contrast. Multiplanar CT image reconstructions of the cervical spine were also generated.  COMPARISON:  Head CT 08/27/2014  FINDINGS: CT HEAD FINDINGS  Motion degraded study.  Skull and Sinuses:There is soft tissue swelling and hematoma over the right scalp without underlying fracture.  Orbits: No traumatic finding  Brain: No evidence of acute infarction, hemorrhage, hydrocephalus, or mass lesion/mass effect.  There is generalized cortical atrophy which is stable from 2015. Extensive chronic small vessel disease with ischemic gliosis throughout the bilateral cerebral white matter. Chronic remote left occipital cortex infarction.  CT CERVICAL SPINE FINDINGS  No evidence of acute fracture or traumatic malalignment. Mild C3-4 and C4-5 anterolisthesis is chronic based on brain MRI 02/21/2014.  Diffuse degenerative disc and facet disease, which accounts for the above anterolisthesis.  Disc narrowing is advanced in the lower cervical spine, especially at C5-6 and C6-7.  IMPRESSION: 1. Motion degraded study without acute intracranial or cervical spine finding. 2. Right frontal scalp hematoma without fracture. 3. Atrophy and extensive chronic small vessel disease.   Electronically Signed   By: Monte Fantasia M.D.   On: 06/07/2015 08:04   Dg Hip Unilat With Pelvis 2-3 Views Right  06/07/2015   CLINICAL DATA:  Fall with hip pain.  Initial encounter.  EXAM: DG HIP (WITH OR WITHOUT PELVIS) 2-3V RIGHT  COMPARISON:  12/04/2014  FINDINGS: Bilateral total  hip arthroplasty. There is irregularity at the base of the right greater trochanter where fracture was seen on comparison study March 2016. Lucency along the base of the lesser trochanter on the right, not seen previously. No indication of hardware loosening or femoral shaft fracture.  There is a comminuted fracture of the right pubic body and superior pubic ramus.  The left hip prosthesis appears intact and located.  IMPRESSION: 1. Right pubic body and superior pubic ramus fracture. 2. Bilateral total hip arthroplasty. Lucency through the bases of the right greater and lesser trochanters which is likely related to fracture seen 12/04/2014. Recommend CT to exclude acute on chronic femur fracture.   Electronically Signed   By: Monte Fantasia M.D.   On: 06/07/2015 08:17   I have personally reviewed and evaluated these images and lab results as part of my medical decision-making.    MDM   Final diagnoses:  Fracture of right superior pubic ramus, closed, initial encounter  Fall, initial encounter  Scalp hematoma, initial encounter  Face lacerations, initial encounter    81 year old female, with history of Alzheimer's dementia and no use of anticoagulants, who presents after fall from nursing facility. She has a history of bilateral hip arthroplasties. She is nontoxic and in no acute distress on presentation. Vital signs are not concerning.  On trauma exam notable for tenderness in the right hip, with range of motion and palpation. Also complaining of some mild chest wall tenderness to palpation. Complained of some low back tenderness, but with palpation reports that she no longer had any pain. She has a 1.5 cm superficial laceration to left temple with bleeding controlled. Will steri-strip. Tetanus is updated. Will obtain Ct head, cervical spine and XR lumbar spine, chest, and right hip/pelvis.   CT head and cervical spine does not reveal acute traumatic injuries aside from scalp hematoma. X-ray of her chest and lumbar spine are unremarkable. She has a new fracture involving the right pubic body and superior pubic ramus. There is an old fracture involving the right greater and lesser trochanters in the setting of prior right hip arthroplasty. Discussed with Dr. Nonnie Done from orthopedic surgery at 8:30AM. States this is non-operative and she should be weight bearing as tolerated. Requires admit for pain control. Discussed with hospitalist service for admission.  Forde Dandy, MD 06/07/15 [phone]

## 2015-06-07 NOTE — Progress Notes (Signed)
RN notified [facility] that patients urine is bright red. Nursing will continue to monitor.

## 2015-06-07 NOTE — ED Notes (Signed)
Patient states needs to urinate, EMS and I assisted patient to bed pan, patient unable to void. We removed bed pan. Patient states she is in great pain.

## 2015-06-07 NOTE — ED Notes (Signed)
MD at bedside. Report received at bedside. Bruising noted to right forehead, patient alert and per EMS at baseline with orientation.

## 2015-06-07 NOTE — ED Notes (Signed)
Called patient's daughter(Cindy) in [place] regarding delay in transportation.

## 2015-06-07 NOTE — ED Notes (Addendum)
Called patient's daughter [name] at [phone] regarding placement at [facility] and provide the phone number and location.  Cindy (dtg) mention that when hospitalized she becomes very agitation tried to get OOB and high fall risk, required Haldol to help with agitation. Daughter request that the new floor nurse calls her and she wants her to be seen by orthopedic physician.

## 2015-06-07 NOTE — H&P (Signed)
History and Physical  Kaitlin Rodriguez QIO:[id number] DOB: 02-Nov-1927 DOA: 06/07/2015  PCP: Reymundo Poll, MD   Chief Complaint: Fall  History of Present Illness:  Patient is a 81 year old female with history of Alzheimer disease, HLD, HTN, bilateral hip arthroplasty, RCC who was transferred from outside facility due to fall. Patient can't remember the event and denies any complaints except mild lower back pain. Reviewing records shows that patient had a mechanical fall that had CXR, CTH, pelvic xrays, CT cervical spine that showed pubic fx adn suggested CT scan of pelvis to rule out acute on chronic femur fracture. Ortho evaluated patient and advised no surgical intervention and admission to medicine for pain management.   Review of Systems:  CONSTITUTIONAL:  No night sweats.  No fatigue, malaise, lethargy.  No fever or chills. Eyes:  No visual changes.  No eye pain.  No eye discharge.   ENT:    No epistaxis.  No sinus pain.  No sore throat.  No ear pain.  No congestion. RESPIRATORY:  No cough.  No wheeze.  No hemoptysis.  No shortness of breath. CARDIOVASCULAR:  No chest pains.  No palpitations. GASTROINTESTINAL:  No abdominal pain.  No nausea or vomiting.  No diarrhea or constipation.  No hematemesis.  No hematochezia.  No melena. GENITOURINARY:  No urgency.  No frequency.  No dysuria.  No hematuria.  No obstructive symptoms.  No discharge.  No pain.  No significant abnormal bleeding. MUSCULOSKELETAL:  Back pain.  No joint swelling.  No arthritis. NEUROLOGICAL:  No confusion.  No weakness. No headache. No seizure. PSYCHIATRIC:  No depression. No anxiety. No suicidal ideation. SKIN:  No rashes.  No lesions.  No wounds. ENDOCRINE:  No unexplained weight loss.  No polydipsia.  No polyuria.  No polyphagia. HEMATOLOGIC:  No anemia.  No purpura.  No petechiae.  No bleeding.  ALLERGIC AND IMMUNOLOGIC:  No pruritus.  No swelling Other:  Past Medical and Surgical History:   Past  Medical History  Diagnosis Date  . Hypertension   . Hyperlipidemia   . Complication of anesthesia     "didn't wake up for a long time, pale, hypotension; got psychotic after one of her hip ORs in ~ 2010"  . Positive TB test     "father passed away from TB; she's negative/CXR"  . History of blood transfusion 2004    "related to hip replacement"  . Blood type, Rh negative   . Osteoarthritis     "severe in her hands" (12/04/2014)  . Alzheimer's dementia     "50% loss"  . Cancer of kidney   . Acute pancreatitis hospitalized 07/2014   Past Surgical History  Procedure Laterality Date  . Thyroid surgery  1960's    "took out tumor"  . Joint replacement    . Total hip arthroplasty Bilateral 2004-2010  . Abdominal hysterectomy    . Nephrectomy  ~ 2011    "cancer; it was contained"  . Tonsillectomy    . Breast lumpectomy      "benign"  . Cataract extraction w/ intraocular lens implant      "? side"    Social History:   reports that she has never smoked. She has never used smokeless tobacco. She reports that she does not drink alcohol or use illicit drugs.   Allergies  Allergen Reactions  . Oxybutynin Rash  . Sulfa Antibiotics Other (See Comments)    Per MAR   FH:  Can't provide due to dementia  Prior to Admission medications   Medication Sig Start Date End Date Taking? Authorizing Provider  acetaminophen (TYLENOL) 325 MG tablet Take 650 mg by mouth daily as needed for moderate pain (pain).    Yes Historical Provider, MD  atorvastatin (LIPITOR) 40 MG tablet Take 40 mg by mouth at bedtime.    Yes Historical Provider, MD  cholecalciferol (VITAMIN D) 1000 UNITS tablet Take 1,000 Units by mouth daily.   Yes Historical Provider, MD  docusate sodium (COLACE) 100 MG capsule Take 100 mg by mouth 2 (two) times daily.   Yes Historical Provider, MD  donepezil (ARICEPT) 10 MG tablet Take 10 mg by mouth at bedtime.    Yes Historical Provider, MD  feeding supplement, ENSURE COMPLETE, (ENSURE  COMPLETE) LIQD Take 237 mLs by mouth 2 (two) times daily between meals. 12/07/14  Yes Costin Karlyne Greenspan, MD  hydrocortisone (PROCTOCORT) 1 % CREA Apply 1 application topically daily as needed (hemorrhoids).   Yes Historical Provider, MD  ondansetron (ZOFRAN) 4 MG tablet Take 4 mg by mouth every 6 (six) hours as needed for nausea.    Yes Historical Provider, MD  Petrolatum (SECURA PROTECTIVE) OINT Apply 1 application topically 3 (three) times daily. Apply to buttocks for skin protection   Yes Historical Provider, MD  polyethylene glycol (MIRALAX / GLYCOLAX) packet Take 17 g by mouth daily. Mix in 8 oz of liquid and drink   Yes Historical Provider, MD  traMADol (ULTRAM) 50 MG tablet Take 50 mg by mouth See admin instructions. Take 1 tablet (50 mg) 3 times daily, may also take 1 tablet (50 mg) every 6 hours as needed for pain (in between daily doses)   Yes Historical Provider, MD  triamcinolone (KENALOG) 0.025 % cream Apply 1 application topically 2 (two) times daily. Apply to back and abdominal area twice daily for rash   Yes Historical Provider, MD  HYDROcodone-acetaminophen (NORCO/VICODIN) 5-325 MG per tablet Take 1-2 tablets by mouth every 4 (four) hours as needed for moderate pain. Patient not taking: Reported on 06/07/2015 12/07/14   Caren Griffins, MD    Physical Exam: BP 95/61 mmHg  Pulse 80  Temp(Src) 98.2 F (36.8 C) (Oral)  Resp 18  Ht 5' 1.5" (1.562 m)  Wt 45.36 kg (100 lb)  BMI 18.59 kg/m2  SpO2 97%  GENERAL : Well developed, well nourished, alert and cooperative, and appears to be in no acute distress. HEAD: normocephalic. Small hematoma  With medium size bruise on right temporal area EYES: PERRL, EOMI. Fundi normal, vision is grossly intact. EARS: External auditory canals and tympanic membranes clear, hearing grossly intact. NOSE: No nasal discharge. THROAT: Oral cavity and pharynx normal. No inflammation, swelling, exudate, or lesions.  NECK: Neck supple CARDIAC: Normal S1 and  S2. No S3, S4 or murmurs. Rhythm is regular. There is no peripheral edema, cyanosis or pallor. . LUNGS: Clear to auscultation and percussion without rales, rhonchi, wheezing or diminished breath sounds. ABDOMEN: Positive bowel sounds. Soft, non distended. No guarding or rebound. No masses. Mild abdominal tenderness to palpation.  MUSKULOSKELETAL:  ROM intact spine and extremities.  EXTREMITIES: No significant deformity or joint abnormality. \ NEUROLOGICAL: The mental examination revealed the patient was not oriented to person, place, and time.CN II-XII intact. Strength and sensation symmetric and intact throughout.  SKIN:  Bruise on right temporal area.  .          Labs on Admission:  Reviewed.   Radiological Exams on Admission: Dg Chest 1 View  06/07/2015  CLINICAL DATA:  Fall  EXAM: CHEST  1 VIEW  COMPARISON:  08/27/2014  FINDINGS: The heart size and mediastinal contours are within normal limits. Both lungs are clear but hyperinflated which may suggest emphysema. The visualized skeletal structures are unremarkable.  IMPRESSION: No active disease.   Electronically Signed   By: Conchita Paris M.D.   On: 06/07/2015 08:16   Dg Lumbar Spine Complete  06/07/2015   CLINICAL DATA:  81 year old female with a history of fall. Right hip pain.  EXAM: LUMBAR SPINE - COMPLETE 4+ VIEW  COMPARISON:  None.  FINDINGS: Anterior view demonstrates mild right apex scoliotic curvature secondary to degenerative changes.  Vertebral bodies relatively aligned with no subluxation. Subtle anterolisthesis of L5 on S1. Subtle retrolisthesis of L3 on L4.  Vertebral body heights maintained.  No acute fracture line identified.  Osteopenia.  Degenerative disc disease is worst at the L4-L5 and L5-S1 levels.  Oblique images demonstrate no displaced pars defect.  Calcifications of the vasculature.  Surgical changes of the right abdomen.  Surgical changes of bilateral total hip arthroplasty.  IMPRESSION: No radiographic evidence of  acute fracture or malalignment of the lumbar spine.  Atherosclerosis.  Surgical changes of the abdomen and of bilateral hip total arthroplasty.  Signed,  Dulcy Fanny. Earleen Newport, DO  [facility]  [facility]   Electronically Signed   By: Corrie Mckusick D.O.   On: 06/07/2015 08:17   Ct Head Wo Contrast  06/07/2015   CLINICAL DATA:  Fall with laceration to the right eye.  Confusion.  EXAM: CT HEAD WITHOUT CONTRAST  CT CERVICAL SPINE WITHOUT CONTRAST  TECHNIQUE: Multidetector CT imaging of the head and cervical spine was performed following the standard protocol without intravenous contrast. Multiplanar CT image reconstructions of the cervical spine were also generated.  COMPARISON:  Head CT 08/27/2014  FINDINGS: CT HEAD FINDINGS  Motion degraded study.  Skull and Sinuses:There is soft tissue swelling and hematoma over the right scalp without underlying fracture.  Orbits: No traumatic finding  Brain: No evidence of acute infarction, hemorrhage, hydrocephalus, or mass lesion/mass effect.  There is generalized cortical atrophy which is stable from 2015. Extensive chronic small vessel disease with ischemic gliosis throughout the bilateral cerebral white matter. Chronic remote left occipital cortex infarction.  CT CERVICAL SPINE FINDINGS  No evidence of acute fracture or traumatic malalignment. Mild C3-4 and C4-5 anterolisthesis is chronic based on brain MRI 02/21/2014.  Diffuse degenerative disc and facet disease, which accounts for the above anterolisthesis. Disc narrowing is advanced in the lower cervical spine, especially at C5-6 and C6-7.  IMPRESSION: 1. Motion degraded study without acute intracranial or cervical spine finding. 2. Right frontal scalp hematoma without fracture. 3. Atrophy and extensive chronic small vessel disease.   Electronically Signed   By: Monte Fantasia M.D.   On: 06/07/2015 08:04   Ct Cervical Spine Wo Contrast  06/07/2015   CLINICAL DATA:  Fall with  laceration to the right eye.  Confusion.  EXAM: CT HEAD WITHOUT CONTRAST  CT CERVICAL SPINE WITHOUT CONTRAST  TECHNIQUE: Multidetector CT imaging of the head and cervical spine was performed following the standard protocol without intravenous contrast. Multiplanar CT image reconstructions of the cervical spine were also generated.  COMPARISON:  Head CT 08/27/2014  FINDINGS: CT HEAD FINDINGS  Motion degraded study.  Skull and Sinuses:There is soft tissue swelling and hematoma over the right scalp without underlying fracture.  Orbits: No traumatic finding  Brain: No evidence of acute infarction,  hemorrhage, hydrocephalus, or mass lesion/mass effect.  There is generalized cortical atrophy which is stable from 2015. Extensive chronic small vessel disease with ischemic gliosis throughout the bilateral cerebral white matter. Chronic remote left occipital cortex infarction.  CT CERVICAL SPINE FINDINGS  No evidence of acute fracture or traumatic malalignment. Mild C3-4 and C4-5 anterolisthesis is chronic based on brain MRI 02/21/2014.  Diffuse degenerative disc and facet disease, which accounts for the above anterolisthesis. Disc narrowing is advanced in the lower cervical spine, especially at C5-6 and C6-7.  IMPRESSION: 1. Motion degraded study without acute intracranial or cervical spine finding. 2. Right frontal scalp hematoma without fracture. 3. Atrophy and extensive chronic small vessel disease.   Electronically Signed   By: Monte Fantasia M.D.   On: 06/07/2015 08:04   Dg Hip Unilat With Pelvis 2-3 Views Right  06/07/2015   CLINICAL DATA:  Fall with hip pain.  Initial encounter.  EXAM: DG HIP (WITH OR WITHOUT PELVIS) 2-3V RIGHT  COMPARISON:  12/04/2014  FINDINGS: Bilateral total hip arthroplasty. There is irregularity at the base of the right greater trochanter where fracture was seen on comparison study March 2016. Lucency along the base of the lesser trochanter on the right, not seen previously. No indication of  hardware loosening or femoral shaft fracture.  There is a comminuted fracture of the right pubic body and superior pubic ramus.  The left hip prosthesis appears intact and located.  IMPRESSION: 1. Right pubic body and superior pubic ramus fracture. 2. Bilateral total hip arthroplasty. Lucency through the bases of the right greater and lesser trochanters which is likely related to fracture seen 12/04/2014. Recommend CT to exclude acute on chronic femur fracture.   Electronically Signed   By: Monte Fantasia M.D.   On: 06/07/2015 08:17     Assessment/Plan  Mechanical fall:  Resulting in pubic fracture: no surgical intervention per ortho Pain management with morphine 1 mg Q3H ( not in pain at this time) Radiology recs to CT scan pelvis to r/o acute on chronic femur fx: will defer to ortho in am.  Not on AC: Hb is stable.  PT/OT consult  Possible UTI: Mild leukocytosis with UA similar to previous studies. Will check Ucx, holding abx till ucx is back or change in clinical condition. ( already got one dose of ceftriaxone)   Lines & Tubes: peripheral IV DVT prophylaxis: hold  enoxaparin tonight but on SCDs.   Consultants: Ortho, PT/OT. Family Communication: None at bedside  Disposition Plan: pending ortho evaluation and PT/OT evaluation.     Gennaro Africa M.D [facility]

## 2015-06-07 NOTE — ED Notes (Signed)
Rod Can at  [place] to updated on delay due to transportation being diverted .

## 2015-06-07 NOTE — Progress Notes (Signed)
Received called from ED physician at [facility] to admit Kaitlin Rodriguez; 81 y/o female with alzheimer, renal cell carcinoma and prior hx of bilateral arthoplasties. After mechanical fall ended having pubic ramus fracture and right hip pain making her to have to much difficulties bearing wait or ambulating currently. Ortho contacted and recommended pain management and PT; no surgical intervention anticipated. Patient accepted to med-surg bed.  Barton Dubois [phone]

## 2015-06-08 ENCOUNTER — Inpatient Hospital Stay (HOSPITAL_COMMUNITY): Payer: Medicare Other

## 2015-06-08 ENCOUNTER — Encounter (HOSPITAL_COMMUNITY): Payer: Self-pay | Admitting: Radiology

## 2015-06-08 DIAGNOSIS — G309 Alzheimer's disease, unspecified: Secondary | ICD-10-CM

## 2015-06-08 DIAGNOSIS — E785 Hyperlipidemia, unspecified: Secondary | ICD-10-CM | POA: Diagnosis present

## 2015-06-08 DIAGNOSIS — Z96643 Presence of artificial hip joint, bilateral: Secondary | ICD-10-CM | POA: Diagnosis present

## 2015-06-08 DIAGNOSIS — S72001D Fracture of unspecified part of neck of right femur, subsequent encounter for closed fracture with routine healing: Secondary | ICD-10-CM | POA: Diagnosis not present

## 2015-06-08 DIAGNOSIS — S32511A Fracture of superior rim of right pubis, initial encounter for closed fracture: Secondary | ICD-10-CM | POA: Diagnosis not present

## 2015-06-08 DIAGNOSIS — B952 Enterococcus as the cause of diseases classified elsewhere: Secondary | ICD-10-CM | POA: Diagnosis present

## 2015-06-08 DIAGNOSIS — F0280 Dementia in other diseases classified elsewhere without behavioral disturbance: Secondary | ICD-10-CM

## 2015-06-08 DIAGNOSIS — W19XXXA Unspecified fall, initial encounter: Secondary | ICD-10-CM

## 2015-06-08 DIAGNOSIS — Z882 Allergy status to sulfonamides status: Secondary | ICD-10-CM | POA: Diagnosis not present

## 2015-06-08 DIAGNOSIS — N390 Urinary tract infection, site not specified: Secondary | ICD-10-CM | POA: Diagnosis present

## 2015-06-08 DIAGNOSIS — Y92129 Unspecified place in nursing home as the place of occurrence of the external cause: Secondary | ICD-10-CM | POA: Diagnosis not present

## 2015-06-08 DIAGNOSIS — S32591A Other specified fracture of right pubis, initial encounter for closed fracture: Secondary | ICD-10-CM | POA: Diagnosis present

## 2015-06-08 DIAGNOSIS — Z85528 Personal history of other malignant neoplasm of kidney: Secondary | ICD-10-CM | POA: Diagnosis not present

## 2015-06-08 DIAGNOSIS — Z905 Acquired absence of kidney: Secondary | ICD-10-CM | POA: Diagnosis present

## 2015-06-08 DIAGNOSIS — R319 Hematuria, unspecified: Secondary | ICD-10-CM | POA: Diagnosis present

## 2015-06-08 DIAGNOSIS — I10 Essential (primary) hypertension: Secondary | ICD-10-CM | POA: Diagnosis present

## 2015-06-08 DIAGNOSIS — S0181XA Laceration without foreign body of other part of head, initial encounter: Secondary | ICD-10-CM | POA: Diagnosis present

## 2015-06-08 DIAGNOSIS — S0083XA Contusion of other part of head, initial encounter: Secondary | ICD-10-CM | POA: Diagnosis present

## 2015-06-08 LAB — CBC WITH DIFFERENTIAL/PLATELET
BASOS ABS: 0 10*3/uL (ref 0.0–0.1)
BASOS PCT: 0 % (ref 0–1)
Eosinophils Absolute: 0 10*3/uL (ref 0.0–0.7)
Eosinophils Relative: 0 % (ref 0–5)
HEMATOCRIT: 36.5 % (ref 36.0–46.0)
HEMOGLOBIN: 12.3 g/dL (ref 12.0–15.0)
Lymphocytes Relative: 20 % (ref 12–46)
Lymphs Abs: 2.7 10*3/uL (ref 0.7–4.0)
MCH: 32.5 pg (ref 26.0–34.0)
MCHC: 33.7 g/dL (ref 30.0–36.0)
MCV: 96.6 fL (ref 78.0–100.0)
Monocytes Absolute: 1.1 10*3/uL — ABNORMAL HIGH (ref 0.1–1.0)
Monocytes Relative: 8 % (ref 3–12)
NEUTROS ABS: 9.3 10*3/uL — AB (ref 1.7–7.7)
NEUTROS PCT: 72 % (ref 43–77)
Platelets: 310 10*3/uL (ref 150–400)
RBC: 3.78 MIL/uL — AB (ref 3.87–5.11)
RDW: 14.1 % (ref 11.5–15.5)
WBC: 13.1 10*3/uL — ABNORMAL HIGH (ref 4.0–10.5)

## 2015-06-08 MED ORDER — ACETAMINOPHEN 325 MG PO TABS
650.0000 mg | ORAL_TABLET | ORAL | Status: DC | PRN
  Administered 2015-06-09 – 2015-06-11 (×5): 650 mg via ORAL
  Filled 2015-06-08 (×5): qty 2

## 2015-06-08 MED ORDER — DEXTROSE 5 % IV SOLN
1.0000 g | INTRAVENOUS | Status: DC
  Administered 2015-06-09 – 2015-06-10 (×2): 1 g via INTRAVENOUS
  Filled 2015-06-08 (×4): qty 10

## 2015-06-08 NOTE — Evaluation (Signed)
Physical Therapy Evaluation Patient Details Name: Kaitlin Rodriguez MRN: [id number] DOB: October 21, 1927 Today's Date: 06/08/2015   History of Present Illness  Patient is a 81 year old female with history of Alzheimer disease, HLD, HTN, bilateral hip arthroplasty, RCC who was transferred from outside facility due to fall. Patient can't remember the event and denies any complaints except mild lower back pain. Reviewing records shows that patient had a mechanical fall that had CXR, CTH, pelvic xrays, CT cervical spine that showed pubic fx adn suggested CT scan of pelvis to rule out acute on chronic femur fracture. Ortho evaluated patient and advised no surgical intervention and admission to medicine for pain management.   Clinical Impression   Pt admitted with above diagnosis. Pt currently with functional limitations due to the deficits listed below (see PT Problem List).  Pt will benefit from skilled PT to increase their independence and safety with mobility to allow discharge to the venue listed below.       Follow Up Recommendations SNF    Equipment Recommendations  Rolling walker with 5" wheels;3in1 (PT)    Recommendations for Other Services       Precautions / Restrictions Precautions Precautions: Fall Restrictions Weight Bearing Restrictions: Yes RLE Weight Bearing:  (Proceeded with minimizing WB RLE until otherwise ordered)      Mobility  Bed Mobility Overal bed mobility: Needs Assistance Bed Mobility: Supine to Sit     Supine to sit: Max assist     General bed mobility comments: Cues for technqiue, and pt initiated well; was limited by pain once we started moving; seemed most comfortable keeping knees together while moving to EOB; max assist ot supoprt shoulders and encourage pt while moving to sitting up position  Transfers Overall transfer level: Needs assistance Equipment used: 1 person hand held assist Transfers: Sit to/from [facility] Sit to Stand: Mod  assist Stand pivot transfers: Mod assist       General transfer comment: Encouraged pt to keep weight off of RLE (CT of Rhip results not in yet at time of eval) during transition bed to [facility] (where she was able to void) then to recliner; mod assist to rise and then transition to next seat; close monitor to keep weight off of RLE  Ambulation/Gait                Stairs            Wheelchair Mobility    Modified Rankin (Stroke Patients Only)       Balance Overall balance assessment: History of Falls                                           Pertinent Vitals/Pain Pain Assessment: Faces Faces Pain Scale: Hurts even more Pain Location: R LE, groin, hip and back Pain Descriptors / Indicators: Grimacing;Guarding Pain Intervention(s): Limited activity within patient's tolerance;Monitored during session;Repositioned;Other (comment) (minimized weight bearing until definiteve WB status is given)    Home Living Family/patient expects to be discharged to:: Skilled nursing facility                      Prior Function           Comments: Pt unable to give reliable information re: PLOF     Hand Dominance        Extremity/Trunk Assessment   Upper Extremity  Assessment: Overall WFL for tasks assessed           Lower Extremity Assessment: RLE deficits/detail;LLE deficits/detail RLE Deficits / Details: decr motion and strength, limted by pain LLE Deficits / Details: Able to stand on LLE, pain with moving, though; performed bed mobility with LEs together as a position of comfort     Communication   Communication: No difficulties  Cognition Arousal/Alertness: Awake/alert Behavior During Therapy: WFL for tasks assessed/performed;Restless;Impulsive Overall Cognitive Status: No family/caregiver present to determine baseline cognitive functioning       Memory: Decreased short-term memory (eith history of dementia)               General Comments General comments (skin integrity, edema, etc.): Paged Dr. Tana Coast re; getting a definitive WB status entered in orders    Exercises        Assessment/Plan    PT Assessment Patient needs continued PT services  PT Diagnosis Difficulty walking;Acute pain   PT Problem List Decreased strength;Decreased range of motion;Decreased activity tolerance;Decreased balance;Decreased mobility;Decreased coordination;Decreased cognition;Decreased knowledge of use of DME;Decreased safety awareness;Decreased knowledge of precautions;Pain  PT Treatment Interventions DME instruction;Gait training;Functional mobility training;Therapeutic activities;Therapeutic exercise;Balance training;Patient/family education;Cognitive remediation   PT Goals (Current goals can be found in the Care Plan section) Acute Rehab PT Goals PT Goal Formulation: Patient unable to participate in goal setting Time For Goal Achievement: 06/22/15 Potential to Achieve Goals: Good    Frequency Min 3X/week   Barriers to discharge        Co-evaluation               End of Session Equipment Utilized During Treatment: Gait belt Activity Tolerance: Patient tolerated treatment well Patient left: in chair;with call bell/phone within reach;with chair alarm set Nurse Communication: Mobility status         Time: 8469-6295 PT Time Calculation (min) (ACUTE ONLY): 18 min   Charges:   PT Evaluation $Initial PT Evaluation Tier I: 1 Procedure     PT G CodesQuin Hoop 06/08/2015, 2:48 PM  Roney Marion, [place] Pager [phone] Office [phone]

## 2015-06-08 NOTE — Progress Notes (Addendum)
Called by NT that patient was found on the floor. Upon arrival  to patient's room, she was leanning forward against the recliner and her knees was almost touched to the floor. The chair alarm did not go off when she attempted to get out. Not sure if patient hit on the floor or not. The NT sateted that she did not move patient or check the alarm after physical therapist put patient to the recliner.Patient declined hitting on the floor, denied pain from upper or lower extremities. Patient had an positive pain on the anterior lower abdomin. No obvious hematoma on the head. No obvious new bruises on her skin. Patient did not have obvious new symptoms from her baseline. Notified Dr. Tana Coast, get an order of safety sitter for patient. Order carried out this time. Requested sitter from Staff but be on the list for getting a sitter duo to priority needs (suicide patient has higher priority than others). Transfer patient to camera room per Dr. Tana Coast oral order. Patient's daughter notified as well. Will continue to monitor patient closely.

## 2015-06-08 NOTE — Progress Notes (Signed)
OT Cancellation Note  Patient Details Name: Kaitlin Rodriguez MRN: [id number] DOB: 1927-10-12   Cancelled Treatment:    Reason Eval/Treat Not Completed:  (OT screened) Pt is Medicare and current D/C plan is SNF. No apparent immediate acute care OT needs, therefore will defer OT to SNF. If OT eval is needed please call Acute Rehab Dept. at [phone] or text page OT at [phone].    Benito Mccreedy OTR/L [phone] 06/08/2015, 4:35 PM

## 2015-06-08 NOTE — Progress Notes (Signed)
[facility] MD called RN back to inform RN that there has been an order placed for an ultrasound abdomen complete to rule out additional organ damage in reference to patients bloody urine collected earlier in the shift. RN was called by Ultrasound department and informed to place patient NPO at midnight (so that the patient could properly take contrast for preparation of the procedure) and that the procedure would be completed sometime in the morning hours. Patients daughter [name] from [place] called to inquire about the status of her mothers condition. RN informed daughter that mother was slightly restless and attempted get out of the bed however bed alarms have been placed on the patients bed and nursing is continuing to monitor patient closely. RN informed daughter that there is an ortho consultation ordered for the patient so that it can be determined if patient needs surgical management. Daughter informed RN that she currently resides in [place] and will not be able to make it to [place] until Wednesday however she stated that she will approve a verbal consent over the phone for her mothers procedure in lieu of the fact that her mother will not be able to sign the surgical consent because she is demented. Daughter also informed RN that mother will begin to pull out IVs even attempt to get out of bed when her "psycosis " as described by the daughter kicks in. Cindy's phone number is [phone]. Please call daughter as soon as possible with any current or significant changes. Nursing will continue to monitor.

## 2015-06-08 NOTE — Progress Notes (Signed)
[facility]                                                                              Patient Demographics  Kaitlin Rodriguez, is a 81 y.o. female, DOB - 05-28-1928, MGQ:[id number]  Admit date - 06/07/2015   Admitting Physician Kaitlin Africa, MD  Outpatient Primary MD for the patient is Kaitlin Poll, MD  LOS - 1   Chief Complaint  Patient presents with  . Fall       Brief HPI   Patient is a 81 year old female with history of Alzheimer disease, HLD, HTN, bilateral hip arthroplasty, RCC who was transferred from outside facility due to fall. Patient could not remember the event and denied any complaints except mild lower back pain. Reviewing records shows that patient had a mechanical fall that had CXR, CTH, pelvic xrays, CT cervical spine. Hip x-ray showed right pubic body and superior pubic ramus fracture, bilateral total hip arthroplasty , rule out acute on chronic femur fracture. Ortho evaluated patient and advised no surgical intervention and admission to medicine for pain management.    Assessment & Plan    Principal Problem:   Fracture of right superior pubic ramus - Continue pain management, orthopedics was consulted and recommended conservative management, physical therapy, no surgical intervention - Obtain CT of the right hip to rule out any acute on chronic femur fracture - PTOT evaluation  Active Problems:   UTI (lower urinary tract infection) Continue IV Rocephin, follow urine culture and sensitivities  Bilateral hip arthroplasty with right hip pain - Obtain CT of the right hip to rule out any acute on chronic femur fracture    Alzheimer's dementia - Currently appears to be at baseline, continue Aricept    Hematuria -Possibly due to UTI however obtain renal ultrasound. Outpatient follow-up with urology for a cystoscopy.   Code Status: Full code l code  Family Communication: Discussed in detail with the patient, all imaging results, lab results  explained to the patient, no family at bed side    Disposition Plan: will likely need SNF  Time Spent in minutes  25 minutes  Procedures  None   Consults   ortho  in ED  DVT Prophylaxis SCD's  Medications  Scheduled Meds: . atorvastatin  40 mg Oral QHS  . cefTRIAXone (ROCEPHIN)  IV  1 g Intravenous Q24H  . cholecalciferol  1,000 Units Oral Daily  . docusate sodium  100 mg Oral BID  . donepezil  10 mg Oral QHS  . feeding supplement (ENSURE ENLIVE)  237 mL Oral BID BM   Continuous Infusions:  PRN Meds:.morphine injection   Antibiotics   Anti-infectives    Start     Dose/Rate Route Frequency Ordered Stop   06/08/15 0800  cefTRIAXone (ROCEPHIN) 1 g in dextrose 5 % 50 mL IVPB     1 g 100 mL/hr over 30 Minutes Intravenous Every 24 hours 06/08/15 0709     06/07/15 0945  cefTRIAXone (ROCEPHIN) 1 g in dextrose 5 % 50 mL IVPB     1 g 100 mL/hr over 30 Minutes Intravenous  Once 06/07/15 0930 06/07/15 1011  06/07/15 0935  cefTRIAXone (ROCEPHIN) 1 G injection    Comments:  Kaitlin Rodriguez   : cabinet override      06/07/15 0935 06/07/15 0940        Subjective:   Kaitlin Rodriguez was seen and examined today.  Somewhat confused, pain controlled, overnight had some issues with anxiety. Low-grade temp of 99.4.  patient denies any chest pain, shortness of breath, nausea or vomiting, abdominal pain.   Objective:   Blood pressure 128/57, pulse 72, temperature 99.4 F (37.4 C), temperature source Oral, resp. rate 18, height 5' 1.5" (1.562 m), weight 45.36 kg (100 lb), SpO2 96 %.  Wt Readings from Last 3 Encounters:  06/07/15 45.36 kg (100 lb)  12/04/14 44.453 kg (98 lb)    No intake or output data in the 24 hours ending 06/08/15 1039  Exam  General: Alert and oriented x 1, NAD  HEENT:  PERRLA, EOMI, Anicteric Sclera, mucous membranes moist.   Neck: Supple, no JVD, no masses  CVS: S1 S2 auscultated, no rubs, murmurs or gallops. Regular rate and rhythm.  Respiratory:  Clear to auscultation bilaterally, no wheezing, rales or rhonchi  Abdomen: Soft, nontender, nondistended, + bowel sounds  Ext: no cyanosis clubbing or edema, pain in the right hip on elevating the right leg  Neuro: no new deficits   Skin: No rashes  Psych: Normal affect and demeanor, alert and oriented x1    Data Review   Micro Results Recent Results (from the past 240 hour(s))  Urine culture     Status: None (Preliminary result)   Collection Time: 06/07/15  8:37 AM  Result Value Ref Range Status   Specimen Description URINE, CLEAN CATCH  Final   Special Requests NONE  Final   Culture   Final    TOO YOUNG TO READ Performed at [facility]    Report Status PENDING  Incomplete    Radiology Reports Dg Chest 1 View  06/07/2015   CLINICAL DATA:  Fall  EXAM: CHEST  1 VIEW  COMPARISON:  08/27/2014  FINDINGS: The heart size and mediastinal contours are within normal limits. Both lungs are clear but hyperinflated which may suggest emphysema. The visualized skeletal structures are unremarkable.  IMPRESSION: No active disease.   Electronically Signed   By: Kaitlin Rodriguez M.D.   On: 06/07/2015 08:16   Dg Lumbar Spine Complete  06/07/2015   CLINICAL DATA:  81 year old female with a history of fall. Right hip pain.  EXAM: LUMBAR SPINE - COMPLETE 4+ VIEW  COMPARISON:  None.  FINDINGS: Anterior view demonstrates mild right apex scoliotic curvature secondary to degenerative changes.  Vertebral bodies relatively aligned with no subluxation. Subtle anterolisthesis of L5 on S1. Subtle retrolisthesis of L3 on L4.  Vertebral body heights maintained.  No acute fracture line identified.  Osteopenia.  Degenerative disc disease is worst at the L4-L5 and L5-S1 levels.  Oblique images demonstrate no displaced pars defect.  Calcifications of the vasculature.  Surgical changes of the right abdomen.  Surgical changes of bilateral total hip arthroplasty.  IMPRESSION: No radiographic evidence of acute  fracture or malalignment of the lumbar spine.  Atherosclerosis.  Surgical changes of the abdomen and of bilateral hip total arthroplasty.  Signed,  Kaitlin Rodriguez. Earleen Newport, DO  [facility]  [facility]   Electronically Signed   By: Corrie Mckusick D.O.   On: 06/07/2015 08:17   Ct Head Wo Contrast  06/07/2015   CLINICAL DATA:  Fall with laceration  to the right eye.  Confusion.  EXAM: CT HEAD WITHOUT CONTRAST  CT CERVICAL SPINE WITHOUT CONTRAST  TECHNIQUE: Multidetector CT imaging of the head and cervical spine was performed following the standard protocol without intravenous contrast. Multiplanar CT image reconstructions of the cervical spine were also generated.  COMPARISON:  Head CT 08/27/2014  FINDINGS: CT HEAD FINDINGS  Motion degraded study.  Skull and Sinuses:There is soft tissue swelling and hematoma over the right scalp without underlying fracture.  Orbits: No traumatic finding  Brain: No evidence of acute infarction, hemorrhage, hydrocephalus, or mass lesion/mass effect.  There is generalized cortical atrophy which is stable from 2015. Extensive chronic small vessel disease with ischemic gliosis throughout the bilateral cerebral white matter. Chronic remote left occipital cortex infarction.  CT CERVICAL SPINE FINDINGS  No evidence of acute fracture or traumatic malalignment. Mild C3-4 and C4-5 anterolisthesis is chronic based on brain MRI 02/21/2014.  Diffuse degenerative disc and facet disease, which accounts for the above anterolisthesis. Disc narrowing is advanced in the lower cervical spine, especially at C5-6 and C6-7.  IMPRESSION: 1. Motion degraded study without acute intracranial or cervical spine finding. 2. Right frontal scalp hematoma without fracture. 3. Atrophy and extensive chronic small vessel disease.   Electronically Signed   By: Monte Fantasia M.D.   On: 06/07/2015 08:04   Ct Cervical Spine Wo Contrast  06/07/2015   CLINICAL DATA:  Fall with  laceration to the right eye.  Confusion.  EXAM: CT HEAD WITHOUT CONTRAST  CT CERVICAL SPINE WITHOUT CONTRAST  TECHNIQUE: Multidetector CT imaging of the head and cervical spine was performed following the standard protocol without intravenous contrast. Multiplanar CT image reconstructions of the cervical spine were also generated.  COMPARISON:  Head CT 08/27/2014  FINDINGS: CT HEAD FINDINGS  Motion degraded study.  Skull and Sinuses:There is soft tissue swelling and hematoma over the right scalp without underlying fracture.  Orbits: No traumatic finding  Brain: No evidence of acute infarction, hemorrhage, hydrocephalus, or mass lesion/mass effect.  There is generalized cortical atrophy which is stable from 2015. Extensive chronic small vessel disease with ischemic gliosis throughout the bilateral cerebral white matter. Chronic remote left occipital cortex infarction.  CT CERVICAL SPINE FINDINGS  No evidence of acute fracture or traumatic malalignment. Mild C3-4 and C4-5 anterolisthesis is chronic based on brain MRI 02/21/2014.  Diffuse degenerative disc and facet disease, which accounts for the above anterolisthesis. Disc narrowing is advanced in the lower cervical spine, especially at C5-6 and C6-7.  IMPRESSION: 1. Motion degraded study without acute intracranial or cervical spine finding. 2. Right frontal scalp hematoma without fracture. 3. Atrophy and extensive chronic small vessel disease.   Electronically Signed   By: Monte Fantasia M.D.   On: 06/07/2015 08:04   Dg Hip Unilat With Pelvis 2-3 Views Right  06/07/2015   CLINICAL DATA:  Fall with hip pain.  Initial encounter.  EXAM: DG HIP (WITH OR WITHOUT PELVIS) 2-3V RIGHT  COMPARISON:  12/04/2014  FINDINGS: Bilateral total hip arthroplasty. There is irregularity at the base of the right greater trochanter where fracture was seen on comparison study March 2016. Lucency along the base of the lesser trochanter on the right, not seen previously. No indication of  hardware loosening or femoral shaft fracture.  There is a comminuted fracture of the right pubic body and superior pubic ramus.  The left hip prosthesis appears intact and located.  IMPRESSION: 1. Right pubic body and superior pubic ramus fracture. 2. Bilateral total hip arthroplasty.  Lucency through the bases of the right greater and lesser trochanters which is likely related to fracture seen 12/04/2014. Recommend CT to exclude acute on chronic femur fracture.   Electronically Signed   By: Monte Fantasia M.D.   On: 06/07/2015 08:17    CBC  Recent Labs Lab 06/07/15 0850 06/07/15 2254 06/08/15 0535  WBC 12.7* 11.3* 13.1*  HGB 11.9* 11.8* 12.3  HCT 35.7* 34.5* 36.5  PLT 292 280 310  MCV 95.5 95.0 96.6  MCH 31.8 32.5 32.5  MCHC 33.3 34.2 33.7  RDW 13.5 14.1 14.1  LYMPHSABS 1.2 2.1 2.7  MONOABS 0.9 0.7 1.1*  EOSABS 0.0 0.0 0.0  BASOSABS 0.0 0.0 0.0    Chemistries   Recent Labs Lab 06/07/15 0850 06/07/15 2254  NA 139 138  K 3.8 4.0  CL 105 106  CO2 24 22  GLUCOSE 111* 146*  BUN 27* 21*  CREATININE 0.96 1.02*  CALCIUM 9.2 8.8*  AST  --  26  ALT  --  14  ALKPHOS  --  47  BILITOT  --  0.7   ------------------------------------------------------------------------------------------------------------------ estimated creatinine clearance is 28.9 mL/min (by C-G formula based on Cr of 1.02). ------------------------------------------------------------------------------------------------------------------ No results for input(s): HGBA1C in the last 72 hours. ------------------------------------------------------------------------------------------------------------------ No results for input(s): CHOL, HDL, LDLCALC, TRIG, CHOLHDL, LDLDIRECT in the last 72 hours. ------------------------------------------------------------------------------------------------------------------ No results for input(s): TSH, T4TOTAL, T3FREE, THYROIDAB in the last 72 hours.  Invalid input(s):  FREET3 ------------------------------------------------------------------------------------------------------------------ No results for input(s): VITAMINB12, FOLATE, FERRITIN, TIBC, IRON, RETICCTPCT in the last 72 hours.  Coagulation profile  Recent Labs Lab 06/07/15 2254  INR 1.20    No results for input(s): DDIMER in the last 72 hours.  Cardiac Enzymes No results for input(s): CKMB, TROPONINI, MYOGLOBIN in the last 168 hours.  Invalid input(s): CK ------------------------------------------------------------------------------------------------------------------ Invalid input(s): POCBNP  No results for input(s): GLUCAP in the last 72 hours.   Sharifa Bucholz M.D. [facility] 06/08/2015, 10:39 AM  Pager: [phone] Between 7am to 7pm - call Pager - [phone]  After 7pm go to [email or web address] - password [email or web address]  Call night coverage person covering after 7pm

## 2015-06-09 LAB — CBC
HCT: 38.2 % (ref 36.0–46.0)
HEMOGLOBIN: 12.9 g/dL (ref 12.0–15.0)
MCH: 32.5 pg (ref 26.0–34.0)
MCHC: 33.8 g/dL (ref 30.0–36.0)
MCV: 96.2 fL (ref 78.0–100.0)
Platelets: 301 10*3/uL (ref 150–400)
RBC: 3.97 MIL/uL (ref 3.87–5.11)
RDW: 14.1 % (ref 11.5–15.5)
WBC: 10.5 10*3/uL (ref 4.0–10.5)

## 2015-06-09 LAB — BASIC METABOLIC PANEL
ANION GAP: 11 (ref 5–15)
BUN: 24 mg/dL — ABNORMAL HIGH (ref 6–20)
CALCIUM: 8.8 mg/dL — AB (ref 8.9–10.3)
CHLORIDE: 106 mmol/L (ref 101–111)
CO2: 22 mmol/L (ref 22–32)
Creatinine, Ser: 0.93 mg/dL (ref 0.44–1.00)
GFR calc non Af Amer: 54 mL/min — ABNORMAL LOW (ref >60)
Glucose, Bld: 130 mg/dL — ABNORMAL HIGH (ref 65–99)
Potassium: 3.4 mmol/L — ABNORMAL LOW (ref 3.5–5.1)
SODIUM: 139 mmol/L (ref 135–145)

## 2015-06-09 MED ORDER — POTASSIUM CHLORIDE CRYS ER 20 MEQ PO TBCR
40.0000 meq | EXTENDED_RELEASE_TABLET | Freq: Once | ORAL | Status: AC
  Administered 2015-06-09: 40 meq via ORAL
  Filled 2015-06-09: qty 2

## 2015-06-09 NOTE — Progress Notes (Signed)
[facility]                                                                              Patient Demographics  Kaitlin Rodriguez, is a 81 y.o. female, DOB - 18-Jan-1928, OMV:[id number]  Admit date - 06/07/2015   Admitting Physician Gennaro Africa, MD  Outpatient Primary MD for the patient is Reymundo Poll, MD  LOS - 2   Chief Complaint  Patient presents with  . Fall       Brief HPI   Patient is a 81 year old female with history of Alzheimer disease, HLD, HTN, bilateral hip arthroplasty, RCC who was transferred from outside facility due to fall. Patient could not remember the event and denied any complaints except mild lower back pain. Reviewing records shows that patient had a mechanical fall that had CXR, CTH, pelvic xrays, CT cervical spine. Hip x-ray showed right pubic body and superior pubic ramus fracture, bilateral total hip arthroplasty , rule out acute on chronic femur fracture. Ortho evaluated patient and advised no surgical intervention and admission to medicine for pain management.    Assessment & Plan    Principal Problem:   Fracture of right superior pubic ramus - Continue pain management, orthopedics was consulted and recommended conservative management, physical therapy, no surgical intervention - CT of the right hip showed no acute femur fracture, confirmed pubic fracture  - PTOT evaluation-> skilled nursing facility  Active Problems:   UTI (lower urinary tract infection) Continue IV Rocephin, follow urine culture and sensitivities  Bilateral hip arthroplasty with right hip pain - CT of the right hip negative for acute on chronic femur fracture    Alzheimer's dementia - Currently appears to be at baseline, continue Aricept    Hematuria - Possibly due to UTI - renal ultrasound showed no hydronephrosis, echogenic material in the dependent portion of urinary bladder likely diabetes or possible blood clot, follow-up ultrasound recommended to ensure  resolution. If persists on follow-up ultrasound and neoplasm cannot be excluded. - Outpatient follow-up with urology for a cystoscopy and further workup.   Code Status: Full code l code  Family Communication: Discussed in detail with the patient, all imaging results, lab results explained to the patient, no family at bed side. Called patient's daughter, [name], left detailed message on her phone. Phone number [phone]    Disposition Plan: will likely need SNF, likely DC 24-48 hours  Time Spent in minutes  25 minutes  Procedures  None   Consults   ortho  in ED  DVT Prophylaxis SCD's  Medications  Scheduled Meds: . atorvastatin  40 mg Oral QHS  . cefTRIAXone (ROCEPHIN)  IV  1 g Intravenous Q24H  . cholecalciferol  1,000 Units Oral Daily  . docusate sodium  100 mg Oral BID  . donepezil  10 mg Oral QHS  . feeding supplement (ENSURE ENLIVE)  237 mL Oral BID BM   Continuous Infusions:  PRN Meds:.acetaminophen, morphine injection   Antibiotics   Anti-infectives    Start     Dose/Rate Route Frequency Ordered Stop   06/08/15 0800  cefTRIAXone (ROCEPHIN) 1 g in dextrose 5 % 50 mL  IVPB     1 g 100 mL/hr over 30 Minutes Intravenous Every 24 hours 06/08/15 0709     06/07/15 0945  cefTRIAXone (ROCEPHIN) 1 g in dextrose 5 % 50 mL IVPB     1 g 100 mL/hr over 30 Minutes Intravenous  Once 06/07/15 0930 06/07/15 1011   06/07/15 0935  cefTRIAXone (ROCEPHIN) 1 G injection    Comments:  Huel Coventry   : cabinet override      06/07/15 0935 06/07/15 0940        Subjective:   Kaitlin Rodriguez was seen and examined today. Feeling somewhat better today, afebrile, pain controlled.  patient denies any chest pain, shortness of breath, nausea or vomiting, abdominal pain.   Objective:   Blood pressure 121/76, pulse 72, temperature 98.7 F (37.1 C), temperature source Oral, resp. rate 16, height 5' 1.5" (1.562 m), weight 45.36 kg (100 lb), SpO2 99 %.  Wt Readings from Last 3 Encounters:    06/07/15 45.36 kg (100 lb)  12/04/14 44.453 kg (98 lb)     Intake/Output Summary (Last 24 hours) at 06/09/15 1213 Last data filed at 06/09/15 0900  Gross per 24 hour  Intake     50 ml  Output    150 ml  Net   -100 ml    Exam  General: Alert and oriented x2, NAD  HEENT:  PERRLA, EOMI, Anicteric Sclera, mucous membranes moist.   Neck: Supple, no JVD, no masses  CVS: S1 S2 clear, RRR  Respiratory: CTAB  Abdomen: Soft, nontender, nondistended, + bowel sounds  Ext: no cyanosis clubbing or edema, pain in the right hip on elevating the right leg (states it is my bad leg)   Neuro: no new deficits   Skin: No rashes  Psych: Normal affect and demeanor, alert and oriented x2   Data Review   Micro Results Recent Results (from the past 240 hour(s))  Urine culture     Status: None (Preliminary result)   Collection Time: 06/07/15  8:37 AM  Result Value Ref Range Status   Specimen Description URINE, CLEAN CATCH  Final   Special Requests NONE  Final   Culture   Final    TOO YOUNG TO READ Performed at [facility]    Report Status PENDING  Incomplete    Radiology Reports Dg Chest 1 View  06/07/2015   CLINICAL DATA:  Fall  EXAM: CHEST  1 VIEW  COMPARISON:  08/27/2014  FINDINGS: The heart size and mediastinal contours are within normal limits. Both lungs are clear but hyperinflated which may suggest emphysema. The visualized skeletal structures are unremarkable.  IMPRESSION: No active disease.   Electronically Signed   By: Conchita Paris M.D.   On: 06/07/2015 08:16   Dg Lumbar Spine Complete  06/07/2015   CLINICAL DATA:  81 year old female with a history of fall. Right hip pain.  EXAM: LUMBAR SPINE - COMPLETE 4+ VIEW  COMPARISON:  None.  FINDINGS: Anterior view demonstrates mild right apex scoliotic curvature secondary to degenerative changes.  Vertebral bodies relatively aligned with no subluxation. Subtle anterolisthesis of L5 on S1. Subtle retrolisthesis of L3 on L4.   Vertebral body heights maintained.  No acute fracture line identified.  Osteopenia.  Degenerative disc disease is worst at the L4-L5 and L5-S1 levels.  Oblique images demonstrate no displaced pars defect.  Calcifications of the vasculature.  Surgical changes of the right abdomen.  Surgical changes of bilateral total hip arthroplasty.  IMPRESSION: No radiographic evidence of acute  fracture or malalignment of the lumbar spine.  Atherosclerosis.  Surgical changes of the abdomen and of bilateral hip total arthroplasty.  Signed,  Dulcy Fanny. Earleen Newport, DO  [facility]  [facility]   Electronically Signed   By: Corrie Mckusick D.O.   On: 06/07/2015 08:17   Ct Head Wo Contrast  06/07/2015   CLINICAL DATA:  Fall with laceration to the right eye.  Confusion.  EXAM: CT HEAD WITHOUT CONTRAST  CT CERVICAL SPINE WITHOUT CONTRAST  TECHNIQUE: Multidetector CT imaging of the head and cervical spine was performed following the standard protocol without intravenous contrast. Multiplanar CT image reconstructions of the cervical spine were also generated.  COMPARISON:  Head CT 08/27/2014  FINDINGS: CT HEAD FINDINGS  Motion degraded study.  Skull and Sinuses:There is soft tissue swelling and hematoma over the right scalp without underlying fracture.  Orbits: No traumatic finding  Brain: No evidence of acute infarction, hemorrhage, hydrocephalus, or mass lesion/mass effect.  There is generalized cortical atrophy which is stable from 2015. Extensive chronic small vessel disease with ischemic gliosis throughout the bilateral cerebral white matter. Chronic remote left occipital cortex infarction.  CT CERVICAL SPINE FINDINGS  No evidence of acute fracture or traumatic malalignment. Mild C3-4 and C4-5 anterolisthesis is chronic based on brain MRI 02/21/2014.  Diffuse degenerative disc and facet disease, which accounts for the above anterolisthesis. Disc narrowing is advanced in the lower cervical  spine, especially at C5-6 and C6-7.  IMPRESSION: 1. Motion degraded study without acute intracranial or cervical spine finding. 2. Right frontal scalp hematoma without fracture. 3. Atrophy and extensive chronic small vessel disease.   Electronically Signed   By: Monte Fantasia M.D.   On: 06/07/2015 08:04   Ct Cervical Spine Wo Contrast  06/07/2015   CLINICAL DATA:  Fall with laceration to the right eye.  Confusion.  EXAM: CT HEAD WITHOUT CONTRAST  CT CERVICAL SPINE WITHOUT CONTRAST  TECHNIQUE: Multidetector CT imaging of the head and cervical spine was performed following the standard protocol without intravenous contrast. Multiplanar CT image reconstructions of the cervical spine were also generated.  COMPARISON:  Head CT 08/27/2014  FINDINGS: CT HEAD FINDINGS  Motion degraded study.  Skull and Sinuses:There is soft tissue swelling and hematoma over the right scalp without underlying fracture.  Orbits: No traumatic finding  Brain: No evidence of acute infarction, hemorrhage, hydrocephalus, or mass lesion/mass effect.  There is generalized cortical atrophy which is stable from 2015. Extensive chronic small vessel disease with ischemic gliosis throughout the bilateral cerebral white matter. Chronic remote left occipital cortex infarction.  CT CERVICAL SPINE FINDINGS  No evidence of acute fracture or traumatic malalignment. Mild C3-4 and C4-5 anterolisthesis is chronic based on brain MRI 02/21/2014.  Diffuse degenerative disc and facet disease, which accounts for the above anterolisthesis. Disc narrowing is advanced in the lower cervical spine, especially at C5-6 and C6-7.  IMPRESSION: 1. Motion degraded study without acute intracranial or cervical spine finding. 2. Right frontal scalp hematoma without fracture. 3. Atrophy and extensive chronic small vessel disease.   Electronically Signed   By: Monte Fantasia M.D.   On: 06/07/2015 08:04   US Abdomen Complete  06/08/2015   CLINICAL DATA:  Hematuria after fall.   EXAM: ULTRASOUND ABDOMEN COMPLETE  COMPARISON:  None.  FINDINGS: Gallbladder: Cholelithiasis is noted with the largest stone measuring 9 mm. No gallbladder wall thickening or pericholecystic fluid is noted. No sonographic Murphy's sign is noted.  Common bile duct: Diameter: 6.2  mm proximally but 8.3 mm distally ; correlation with liver function tests is recommended to rule out obstruction.  Liver: 1.7 cm simple cyst is noted in right hepatic lobe. Within normal limits in parenchymal echogenicity.  IVC: No abnormality visualized.  Pancreas: Visualized portion unremarkable.  Spleen: Size and appearance within normal limits.  Status post right nephrectomy.  Left Kidney: Length: 10.7 cm. Echogenicity within normal limits. No mass or hydronephrosis visualized.  Abdominal aorta: No aneurysm visualized.  Other findings: Debris is noted in the urinary bladder lumen. Left ureteral jet is visualized.  IMPRESSION: Cholelithiasis without evidence cholecystitis. Status post right nephrectomy. No hydronephrosis or renal obstruction is noted involving the left kidney.   Electronically Signed   By: Marijo Conception, M.D.   On: 06/08/2015 17:31   US Pelvis Limited  06/08/2015   CLINICAL DATA:  Hematuria after fall.  EXAM: LIMITED ULTRASOUND OF PELVIS  TECHNIQUE: Limited transabdominal ultrasound examination of the pelvis was performed.  COMPARISON:  None.  FINDINGS: Status post hysterectomy. Echogenic material seen in the dependent portion of the urinary bladder which most likely represents debris or possibly clot. Left ureteral jet is visualized. Patient is status post right nephrectomy.  IMPRESSION: Echogenic material seen in dependent portion of urinary bladder which most likely represents debris or possibly blood clot. Followup ultrasound is recommended to ensure resolution ; if it persists on followup ultrasound, then neoplasm cannot be excluded.   Electronically Signed   By: Marijo Conception, M.D.   On: 06/08/2015 17:38    Ct Hip Right Wo Contrast  06/08/2015   CLINICAL DATA:  RIGHT hip fracture.  Fall 06/07/2015.  EXAM: CT OF THE RIGHT HIP WITHOUT CONTRAST  TECHNIQUE: Multidetector CT imaging of the right hip was performed according to the standard protocol. Multiplanar CT image reconstructions were also generated.  COMPARISON:  07/22/2014.  Radiographs 12/04/2014.  FINDINGS: Acute nondisplaced LEFT obturator ring fractures are present. These appear at the root of the RIGHT superior pubic ramus and parasymphyseal RIGHT pubis. Compared to recent plain film 06/06/2014, there is little interval change in the appearance of the RIGHT proximal femur. Ossified callus is present along the anterior aspect of the RIGHT greater trochanter which has developed since 07/22/2014 and is consistent with healing of an interval fracture. Posteriorly, of the greater trochanteric fracture plane remains visible. There is no loosening of the femoral or acetabular components. Pre-existing acetabula protrusio is present. The RIGHT iliac bone appears normal. Iliofemoral atherosclerosis.  Referencing the recent prior plain films, there is no lesser trochanter fracture, as suggested by lucency at the base of the lesser trochanter. This artifactual appearance is probably produced by periosteal calcification associated with the greater trochanter fracture.  IMPRESSION: 1. Acute or subacute RIGHT obturator ring fractures, nondisplaced. 2. Intact RIGHT hip arthroplasty components. 3. Healing chronic periprosthetic RIGHT greater trochanter fracture.   Electronically Signed   By: Dereck Ligas M.D.   On: 06/08/2015 12:17   Dg Hip Unilat With Pelvis 2-3 Views Right  06/07/2015   CLINICAL DATA:  Fall with hip pain.  Initial encounter.  EXAM: DG HIP (WITH OR WITHOUT PELVIS) 2-3V RIGHT  COMPARISON:  12/04/2014  FINDINGS: Bilateral total hip arthroplasty. There is irregularity at the base of the right greater trochanter where fracture was seen on comparison  study March 2016. Lucency along the base of the lesser trochanter on the right, not seen previously. No indication of hardware loosening or femoral shaft fracture.  There is a comminuted fracture of the  right pubic body and superior pubic ramus.  The left hip prosthesis appears intact and located.  IMPRESSION: 1. Right pubic body and superior pubic ramus fracture. 2. Bilateral total hip arthroplasty. Lucency through the bases of the right greater and lesser trochanters which is likely related to fracture seen 12/04/2014. Recommend CT to exclude acute on chronic femur fracture.   Electronically Signed   By: Monte Fantasia M.D.   On: 06/07/2015 08:17    CBC  Recent Labs Lab 06/07/15 0850 06/07/15 2254 06/08/15 0535 06/09/15 0729  WBC 12.7* 11.3* 13.1* 10.5  HGB 11.9* 11.8* 12.3 12.9  HCT 35.7* 34.5* 36.5 38.2  PLT 292 280 310 301  MCV 95.5 95.0 96.6 96.2  MCH 31.8 32.5 32.5 32.5  MCHC 33.3 34.2 33.7 33.8  RDW 13.5 14.1 14.1 14.1  LYMPHSABS 1.2 2.1 2.7  --   MONOABS 0.9 0.7 1.1*  --   EOSABS 0.0 0.0 0.0  --   BASOSABS 0.0 0.0 0.0  --     Chemistries   Recent Labs Lab 06/07/15 0850 06/07/15 2254 06/09/15 0646  NA 139 138 139  K 3.8 4.0 3.4*  CL 105 106 106  CO2 24 22 22   GLUCOSE 111* 146* 130*  BUN 27* 21* 24*  CREATININE 0.96 1.02* 0.93  CALCIUM 9.2 8.8* 8.8*  AST  --  26  --   ALT  --  14  --   ALKPHOS  --  47  --   BILITOT  --  0.7  --    ------------------------------------------------------------------------------------------------------------------ estimated creatinine clearance is 31.7 mL/min (by C-G formula based on Cr of 0.93). ------------------------------------------------------------------------------------------------------------------ No results for input(s): HGBA1C in the last 72 hours. ------------------------------------------------------------------------------------------------------------------ No results for input(s): CHOL, HDL, LDLCALC, TRIG,  CHOLHDL, LDLDIRECT in the last 72 hours. ------------------------------------------------------------------------------------------------------------------ No results for input(s): TSH, T4TOTAL, T3FREE, THYROIDAB in the last 72 hours.  Invalid input(s): FREET3 ------------------------------------------------------------------------------------------------------------------ No results for input(s): VITAMINB12, FOLATE, FERRITIN, TIBC, IRON, RETICCTPCT in the last 72 hours.  Coagulation profile  Recent Labs Lab 06/07/15 2254  INR 1.20    No results for input(s): DDIMER in the last 72 hours.  Cardiac Enzymes No results for input(s): CKMB, TROPONINI, MYOGLOBIN in the last 168 hours.  Invalid input(s): CK ------------------------------------------------------------------------------------------------------------------ Invalid input(s): POCBNP  No results for input(s): GLUCAP in the last 72 hours.   RAI,RIPUDEEP M.D. [facility] 06/09/2015, 12:13 PM  Pager: [phone] Between 7am to 7pm - call Pager - [phone]  After 7pm go to [email or web address] - password [email or web address]  Call night coverage person covering after 7pm

## 2015-06-09 NOTE — Progress Notes (Signed)
Utilization review completed.  

## 2015-06-09 NOTE — Progress Notes (Addendum)
Pt's daughter, [name], called and would like an update from MD regarding mother's status. [phone]

## 2015-06-09 NOTE — Care Management Note (Signed)
Case Management Note  Patient Details  Name: BROOKELIN FELBER MRN: [id number] Date of Birth: 1928/02/17  Subjective/Objective:   81y.o. F from [facility] who sustained fall at facility. Sustained R Superior Pubic Rami Fracture which is not being recommended for surgical repair. PT recommends SNF pt is 2+ A. Continue to manage pain with po pain meds and treat UTI with Rocephin  Action/Plan: Will return to SNF when medically stable. Will continue to follow. No OT needed.    Expected Discharge Date:                  Expected Discharge Plan:     In-House Referral:     Discharge planning Services     Post Acute Care Choice:    Choice offered to:     DME Arranged:    DME Agency:     HH Arranged:    [place] Agency:     Status of Service:     Medicare Important Message Given:    Date Medicare IM Given:    Medicare IM give by:    Date Additional Medicare IM Given:    Additional Medicare Important Message give by:     If discussed at [place] of Stay Meetings, dates discussed:    Additional Comments:  Delrae Sawyers, RN 06/09/2015, 1:25 PM

## 2015-06-10 LAB — CBC
HEMATOCRIT: 37.9 % (ref 36.0–46.0)
HEMOGLOBIN: 12.2 g/dL (ref 12.0–15.0)
MCH: 31.4 pg (ref 26.0–34.0)
MCHC: 32.2 g/dL (ref 30.0–36.0)
MCV: 97.7 fL (ref 78.0–100.0)
PLATELETS: 315 10*3/uL (ref 150–400)
RBC: 3.88 MIL/uL (ref 3.87–5.11)
RDW: 14.5 % (ref 11.5–15.5)
WBC: 7.2 10*3/uL (ref 4.0–10.5)

## 2015-06-10 LAB — BASIC METABOLIC PANEL
ANION GAP: 7 (ref 5–15)
BUN: 34 mg/dL — AB (ref 6–20)
CHLORIDE: 111 mmol/L (ref 101–111)
CO2: 25 mmol/L (ref 22–32)
Calcium: 8.9 mg/dL (ref 8.9–10.3)
Creatinine, Ser: 1.03 mg/dL — ABNORMAL HIGH (ref 0.44–1.00)
GFR calc Af Amer: 56 mL/min — ABNORMAL LOW (ref >60)
GFR, EST NON AFRICAN AMERICAN: 48 mL/min — AB (ref >60)
GLUCOSE: 105 mg/dL — AB (ref 65–99)
POTASSIUM: 4.3 mmol/L (ref 3.5–5.1)
Sodium: 143 mmol/L (ref 135–145)

## 2015-06-10 LAB — URINE CULTURE

## 2015-06-10 MED ORDER — AMOXICILLIN 500 MG PO CAPS
500.0000 mg | ORAL_CAPSULE | Freq: Two times a day (BID) | ORAL | Status: DC
  Administered 2015-06-10 – 2015-06-11 (×3): 500 mg via ORAL
  Filled 2015-06-10 (×3): qty 1

## 2015-06-10 NOTE — Clinical Social Work Note (Signed)
Clinical Social Work Assessment  Patient Details  Name: Kaitlin Rodriguez MRN: [id number] Date of Birth: [birth year]  Date of referral:  06/10/15               Reason for consult:  Facility Placement                Permission sought to share information with:  Facility Sport and exercise psychologist, Family Supports Permission granted to share information::   (patient is baseline AOX1- dementia)  Name::        Agency::   (SNFs [facility])  Relationship::   (Daughter, [name] [phone])  Contact Information:     Housing/Transportation Living arrangements for the past 2 months:  [place] ([place]) Source of Information:  Adult Children (Daughter [place]) Patient Interpreter Needed:  None Criminal Activity/Legal Involvement Pertinent to Current Situation/Hospitalization:  No - Comment as needed Significant Relationships:  Adult Children [name] daughter) Lives with:  Facility Resident Do you feel safe going back to the place where you live?  No (PT recs SNF) Need for family participation in patient care:     Care giving concerns:  No concerns presented   Facilities manager / plan:  CSW contacted patient's daughter. Of note patient's baseline is dementia AOX1.  Daughter, [name], is main caregiver, but lives out of state ([place]).  Daughter states patient is from [place] [place] and has been to [facility] for [place] in the past.  Daughter would like to have patient considered by [facility] for possible admission.    Employment status:  Retired Forensic scientist:  [employer] PT Recommendations:  Research officer, trade union, [place] / Referral to community resources:  [place]  Patient/Family's Response to care:  Patient's daughter is agreeable to SNF placement  Patient/Family's Understanding of and Emotional Response to Diagnosis, Current Treatment, and Prognosis:  Daughter is aware of patient's prognosis and  needed therapies at time of discharge.  Emotional Assessment Appearance:  Appears stated age Attitude/Demeanor/Rapport:   (appropriate) Affect (typically observed):  Accepting, Adaptable, Appropriate Orientation:  Oriented to Self Alcohol / Substance use:  Not Applicable Psych involvement (Current and /or in the community):  No (Comment)  Discharge Needs  Concerns to be addressed:  No discharge needs identified Readmission within the last 30 days:  No Current discharge risk:  None Barriers to Discharge:  No Barriers Identified   Dulcy Fanny, LCSW 06/10/2015, 12:48 PM

## 2015-06-10 NOTE — Progress Notes (Signed)
[facility]                                                                              Patient Demographics  Kaitlin Rodriguez, is a 81 y.o. female, DOB - [birth year], PPI:[id number]  Admit date - 06/07/2015   Admitting Physician Gennaro Africa, MD  Outpatient Primary MD for the patient is Reymundo Poll, MD  LOS - 3   Chief Complaint  Patient presents with  . Fall       Brief HPI   Patient is a 80 year old female with history of Alzheimer disease, HLD, HTN, bilateral hip arthroplasty, RCC who was transferred from outside facility due to fall. Patient could not remember the event and denied any complaints except mild lower back pain. Reviewing records shows that patient had a mechanical fall that had CXR, CTH, pelvic xrays, CT cervical spine. Hip x-ray showed right pubic body and superior pubic ramus fracture, bilateral total hip arthroplasty , rule out acute on chronic femur fracture. Ortho evaluated patient and advised no surgical intervention and admission to medicine for pain management.    Assessment & Plan    Principal Problem:   Fracture of right superior pubic ramus - Continue pain management, orthopedics was consulted and recommended conservative management, physical therapy, no surgical intervention - CT of the right hip showed no acute femur fracture, confirmed pubic fracture  - PTOT evaluation-> skilled nursing facility  Active Problems:   UTI (lower urinary tract infection) - Urine culture shows more than 100,000 colonies of GPC - Continue IV Rocephin, follow sensitivities  Bilateral hip arthroplasty with right hip pain - CT of the right hip negative for acute on chronic femur fracture    Alzheimer's dementia - Currently appears to be at baseline, continue Aricept    Hematuria - Possibly due to UTI - renal ultrasound showed no hydronephrosis, echogenic material in the dependent portion of urinary bladder likely diabetes or possible blood clot,  follow-up ultrasound recommended to ensure resolution. If persists on follow-up ultrasound and neoplasm cannot be excluded. - Outpatient follow-up with urology for a cystoscopy and further workup.   Code Status: Full code   Family Communication: Discussed in detail with the patient, all imaging results, lab results explained to the patient, no family at bed side. Called patient's daughter, [name], again today 9/13 and was unable to contact her. I left detailed message on her phone. Phone number [phone]    Disposition Plan: will need SNF, likely 24hrs   Time Spent in minutes  25 minutes  Procedures  None   Consults   ortho  in ED  DVT Prophylaxis SCD's  Medications  Scheduled Meds: . atorvastatin  40 mg Oral QHS  . cefTRIAXone (ROCEPHIN)  IV  1 g Intravenous Q24H  . cholecalciferol  1,000 Units Oral Daily  . docusate sodium  100 mg Oral BID  . donepezil  10 mg Oral QHS  . feeding supplement (ENSURE ENLIVE)  237 mL Oral BID BM   Continuous Infusions:  PRN Meds:.acetaminophen, morphine injection   Antibiotics   Anti-infectives    Start     Dose/Rate Route Frequency Ordered Stop  06/08/15 0800  cefTRIAXone (ROCEPHIN) 1 g in dextrose 5 % 50 mL IVPB     1 g 100 mL/hr over 30 Minutes Intravenous Every 24 hours 06/08/15 0709     06/07/15 0945  cefTRIAXone (ROCEPHIN) 1 g in dextrose 5 % 50 mL IVPB     1 g 100 mL/hr over 30 Minutes Intravenous  Once 06/07/15 0930 06/07/15 1011   06/07/15 0935  cefTRIAXone (ROCEPHIN) 1 G injection    Comments:  Huel Coventry   : cabinet override      06/07/15 0935 06/07/15 0940        Subjective:   Brayla Pat was seen and examined today. No complaints except pubic pain per pt (pubic #). afebrile, no acute events overnight.  patient denies any chest pain, shortness of breath, nausea or vomiting, abdominal pain.   Objective:   Blood pressure 105/74, pulse 76, temperature 98.2 F (36.8 C), temperature source Oral, resp. rate 18,  height 5' 1.5" (1.562 m), weight 45.36 kg (100 lb), SpO2 98 %.  Wt Readings from Last 3 Encounters:  06/07/15 45.36 kg (100 lb)  12/04/14 44.453 kg (98 lb)     Intake/Output Summary (Last 24 hours) at 06/10/15 1101 Last data filed at 06/09/15 1500  Gross per 24 hour  Intake    100 ml  Output      0 ml  Net    100 ml    Exam  General: Alert and oriented x3, NAD  HEENT:  PERRLA, EOMI  Neck: Supple, no JVD  CVS: S1 S2 clear, RRR  Respiratory: CTAB  Abdomen: Soft, nontender, nondistended, + bowel sounds  Ext: no cyanosis clubbing or edema  Neuro: no new deficits   Skin: No rashes  Psych: Normal affect and demeanor, alert and oriented x3   Data Review   Micro Results Recent Results (from the past 240 hour(s))  Urine culture     Status: None (Preliminary result)   Collection Time: 06/07/15  8:37 AM  Result Value Ref Range Status   Specimen Description URINE, CLEAN CATCH  Final   Special Requests NONE  Final   Culture   Final    >=100,000 COLONIES/mL GRAM POSITIVE COCCI Performed at [facility]    Report Status PENDING  Incomplete    Radiology Reports Dg Chest 1 View  06/07/2015   CLINICAL DATA:  Fall  EXAM: CHEST  1 VIEW  COMPARISON:  08/27/2014  FINDINGS: The heart size and mediastinal contours are within normal limits. Both lungs are clear but hyperinflated which may suggest emphysema. The visualized skeletal structures are unremarkable.  IMPRESSION: No active disease.   Electronically Signed   By: Conchita Paris M.D.   On: 06/07/2015 08:16   Dg Lumbar Spine Complete  06/07/2015   CLINICAL DATA:  81 year old female with a history of fall. Right hip pain.  EXAM: LUMBAR SPINE - COMPLETE 4+ VIEW  COMPARISON:  None.  FINDINGS: Anterior view demonstrates mild right apex scoliotic curvature secondary to degenerative changes.  Vertebral bodies relatively aligned with no subluxation. Subtle anterolisthesis of L5 on S1. Subtle retrolisthesis of L3 on L4.   Vertebral body heights maintained.  No acute fracture line identified.  Osteopenia.  Degenerative disc disease is worst at the L4-L5 and L5-S1 levels.  Oblique images demonstrate no displaced pars defect.  Calcifications of the vasculature.  Surgical changes of the right abdomen.  Surgical changes of bilateral total hip arthroplasty.  IMPRESSION: No radiographic evidence of acute fracture or malalignment  of the lumbar spine.  Atherosclerosis.  Surgical changes of the abdomen and of bilateral hip total arthroplasty.  Signed,  Dulcy Fanny. Earleen Newport, DO  [facility]  [facility]   Electronically Signed   By: Corrie Mckusick D.O.   On: 06/07/2015 08:17   Ct Head Wo Contrast  06/07/2015   CLINICAL DATA:  Fall with laceration to the right eye.  Confusion.  EXAM: CT HEAD WITHOUT CONTRAST  CT CERVICAL SPINE WITHOUT CONTRAST  TECHNIQUE: Multidetector CT imaging of the head and cervical spine was performed following the standard protocol without intravenous contrast. Multiplanar CT image reconstructions of the cervical spine were also generated.  COMPARISON:  Head CT 08/27/2014  FINDINGS: CT HEAD FINDINGS  Motion degraded study.  Skull and Sinuses:There is soft tissue swelling and hematoma over the right scalp without underlying fracture.  Orbits: No traumatic finding  Brain: No evidence of acute infarction, hemorrhage, hydrocephalus, or mass lesion/mass effect.  There is generalized cortical atrophy which is stable from 2015. Extensive chronic small vessel disease with ischemic gliosis throughout the bilateral cerebral white matter. Chronic remote left occipital cortex infarction.  CT CERVICAL SPINE FINDINGS  No evidence of acute fracture or traumatic malalignment. Mild C3-4 and C4-5 anterolisthesis is chronic based on brain MRI 02/21/2014.  Diffuse degenerative disc and facet disease, which accounts for the above anterolisthesis. Disc narrowing is advanced in the lower cervical  spine, especially at C5-6 and C6-7.  IMPRESSION: 1. Motion degraded study without acute intracranial or cervical spine finding. 2. Right frontal scalp hematoma without fracture. 3. Atrophy and extensive chronic small vessel disease.   Electronically Signed   By: Monte Fantasia M.D.   On: 06/07/2015 08:04   Ct Cervical Spine Wo Contrast  06/07/2015   CLINICAL DATA:  Fall with laceration to the right eye.  Confusion.  EXAM: CT HEAD WITHOUT CONTRAST  CT CERVICAL SPINE WITHOUT CONTRAST  TECHNIQUE: Multidetector CT imaging of the head and cervical spine was performed following the standard protocol without intravenous contrast. Multiplanar CT image reconstructions of the cervical spine were also generated.  COMPARISON:  Head CT 08/27/2014  FINDINGS: CT HEAD FINDINGS  Motion degraded study.  Skull and Sinuses:There is soft tissue swelling and hematoma over the right scalp without underlying fracture.  Orbits: No traumatic finding  Brain: No evidence of acute infarction, hemorrhage, hydrocephalus, or mass lesion/mass effect.  There is generalized cortical atrophy which is stable from 2015. Extensive chronic small vessel disease with ischemic gliosis throughout the bilateral cerebral white matter. Chronic remote left occipital cortex infarction.  CT CERVICAL SPINE FINDINGS  No evidence of acute fracture or traumatic malalignment. Mild C3-4 and C4-5 anterolisthesis is chronic based on brain MRI 02/21/2014.  Diffuse degenerative disc and facet disease, which accounts for the above anterolisthesis. Disc narrowing is advanced in the lower cervical spine, especially at C5-6 and C6-7.  IMPRESSION: 1. Motion degraded study without acute intracranial or cervical spine finding. 2. Right frontal scalp hematoma without fracture. 3. Atrophy and extensive chronic small vessel disease.   Electronically Signed   By: Monte Fantasia M.D.   On: 06/07/2015 08:04   US Abdomen Complete  06/08/2015   CLINICAL DATA:  Hematuria after fall.   EXAM: ULTRASOUND ABDOMEN COMPLETE  COMPARISON:  None.  FINDINGS: Gallbladder: Cholelithiasis is noted with the largest stone measuring 9 mm. No gallbladder wall thickening or pericholecystic fluid is noted. No sonographic Murphy's sign is noted.  Common bile duct: Diameter: 6.2 mm proximally but  8.3 mm distally ; correlation with liver function tests is recommended to rule out obstruction.  Liver: 1.7 cm simple cyst is noted in right hepatic lobe. Within normal limits in parenchymal echogenicity.  IVC: No abnormality visualized.  Pancreas: Visualized portion unremarkable.  Spleen: Size and appearance within normal limits.  Status post right nephrectomy.  Left Kidney: Length: 10.7 cm. Echogenicity within normal limits. No mass or hydronephrosis visualized.  Abdominal aorta: No aneurysm visualized.  Other findings: Debris is noted in the urinary bladder lumen. Left ureteral jet is visualized.  IMPRESSION: Cholelithiasis without evidence cholecystitis. Status post right nephrectomy. No hydronephrosis or renal obstruction is noted involving the left kidney.   Electronically Signed   By: Marijo Conception, M.D.   On: 06/08/2015 17:31   US Pelvis Limited  06/08/2015   CLINICAL DATA:  Hematuria after fall.  EXAM: LIMITED ULTRASOUND OF PELVIS  TECHNIQUE: Limited transabdominal ultrasound examination of the pelvis was performed.  COMPARISON:  None.  FINDINGS: Status post hysterectomy. Echogenic material seen in the dependent portion of the urinary bladder which most likely represents debris or possibly clot. Left ureteral jet is visualized. Patient is status post right nephrectomy.  IMPRESSION: Echogenic material seen in dependent portion of urinary bladder which most likely represents debris or possibly blood clot. Followup ultrasound is recommended to ensure resolution ; if it persists on followup ultrasound, then neoplasm cannot be excluded.   Electronically Signed   By: Marijo Conception, M.D.   On: 06/08/2015 17:38    Ct Hip Right Wo Contrast  06/08/2015   CLINICAL DATA:  RIGHT hip fracture.  Fall 06/07/2015.  EXAM: CT OF THE RIGHT HIP WITHOUT CONTRAST  TECHNIQUE: Multidetector CT imaging of the right hip was performed according to the standard protocol. Multiplanar CT image reconstructions were also generated.  COMPARISON:  07/22/2014.  Radiographs 12/04/2014.  FINDINGS: Acute nondisplaced LEFT obturator ring fractures are present. These appear at the root of the RIGHT superior pubic ramus and parasymphyseal RIGHT pubis. Compared to recent plain film 06/06/2014, there is little interval change in the appearance of the RIGHT proximal femur. Ossified callus is present along the anterior aspect of the RIGHT greater trochanter which has developed since 07/22/2014 and is consistent with healing of an interval fracture. Posteriorly, of the greater trochanteric fracture plane remains visible. There is no loosening of the femoral or acetabular components. Pre-existing acetabula protrusio is present. The RIGHT iliac bone appears normal. Iliofemoral atherosclerosis.  Referencing the recent prior plain films, there is no lesser trochanter fracture, as suggested by lucency at the base of the lesser trochanter. This artifactual appearance is probably produced by periosteal calcification associated with the greater trochanter fracture.  IMPRESSION: 1. Acute or subacute RIGHT obturator ring fractures, nondisplaced. 2. Intact RIGHT hip arthroplasty components. 3. Healing chronic periprosthetic RIGHT greater trochanter fracture.   Electronically Signed   By: Dereck Ligas M.D.   On: 06/08/2015 12:17   Dg Hip Unilat With Pelvis 2-3 Views Right  06/07/2015   CLINICAL DATA:  Fall with hip pain.  Initial encounter.  EXAM: DG HIP (WITH OR WITHOUT PELVIS) 2-3V RIGHT  COMPARISON:  12/04/2014  FINDINGS: Bilateral total hip arthroplasty. There is irregularity at the base of the right greater trochanter where fracture was seen on comparison  study March 2016. Lucency along the base of the lesser trochanter on the right, not seen previously. No indication of hardware loosening or femoral shaft fracture.  There is a comminuted fracture of the right pubic body  and superior pubic ramus.  The left hip prosthesis appears intact and located.  IMPRESSION: 1. Right pubic body and superior pubic ramus fracture. 2. Bilateral total hip arthroplasty. Lucency through the bases of the right greater and lesser trochanters which is likely related to fracture seen 12/04/2014. Recommend CT to exclude acute on chronic femur fracture.   Electronically Signed   By: Monte Fantasia M.D.   On: 06/07/2015 08:17    CBC  Recent Labs Lab 06/07/15 0850 06/07/15 2254 06/08/15 0535 06/09/15 0729 06/10/15 0526  WBC 12.7* 11.3* 13.1* 10.5 7.2  HGB 11.9* 11.8* 12.3 12.9 12.2  HCT 35.7* 34.5* 36.5 38.2 37.9  PLT 292 280 310 301 315  MCV 95.5 95.0 96.6 96.2 97.7  MCH 31.8 32.5 32.5 32.5 31.4  MCHC 33.3 34.2 33.7 33.8 32.2  RDW 13.5 14.1 14.1 14.1 14.5  LYMPHSABS 1.2 2.1 2.7  --   --   MONOABS 0.9 0.7 1.1*  --   --   EOSABS 0.0 0.0 0.0  --   --   BASOSABS 0.0 0.0 0.0  --   --     Chemistries   Recent Labs Lab 06/07/15 0850 06/07/15 2254 06/09/15 0646 06/10/15 0526  NA 139 138 139 143  K 3.8 4.0 3.4* 4.3  CL 105 106 106 111  CO2 24 22 22 25   GLUCOSE 111* 146* 130* 105*  BUN 27* 21* 24* 34*  CREATININE 0.96 1.02* 0.93 1.03*  CALCIUM 9.2 8.8* 8.8* 8.9  AST  --  26  --   --   ALT  --  14  --   --   ALKPHOS  --  47  --   --   BILITOT  --  0.7  --   --    ------------------------------------------------------------------------------------------------------------------ estimated creatinine clearance is 28.6 mL/min (by C-G formula based on Cr of 1.03). ------------------------------------------------------------------------------------------------------------------ No results for input(s): HGBA1C in the last 72  hours. ------------------------------------------------------------------------------------------------------------------ No results for input(s): CHOL, HDL, LDLCALC, TRIG, CHOLHDL, LDLDIRECT in the last 72 hours. ------------------------------------------------------------------------------------------------------------------ No results for input(s): TSH, T4TOTAL, T3FREE, THYROIDAB in the last 72 hours.  Invalid input(s): FREET3 ------------------------------------------------------------------------------------------------------------------ No results for input(s): VITAMINB12, FOLATE, FERRITIN, TIBC, IRON, RETICCTPCT in the last 72 hours.  Coagulation profile  Recent Labs Lab 06/07/15 2254  INR 1.20    No results for input(s): DDIMER in the last 72 hours.  Cardiac Enzymes No results for input(s): CKMB, TROPONINI, MYOGLOBIN in the last 168 hours.  Invalid input(s): CK ------------------------------------------------------------------------------------------------------------------ Invalid input(s): POCBNP  No results for input(s): GLUCAP in the last 72 hours.   Auden Tatar M.D. [facility] 06/10/2015, 11:01 AM  Pager: [phone] Between 7am to 7pm - call Pager - [phone]  After 7pm go to [email or web address] - password [email or web address]  Call night coverage person covering after 7pm

## 2015-06-10 NOTE — Care Management Important Message (Signed)
Important Message  Patient Details  Name: Kaitlin Rodriguez MRN: [id number] Date of Birth: 10/30/1927   Medicare Important Message Given:  Yes-second notification given    Delorse Lek 06/10/2015, 8:45 AM

## 2015-06-10 NOTE — Progress Notes (Signed)
Physical Therapy Treatment Patient Details Name: Kaitlin Rodriguez MRN: [id number] DOB: 05-22-1928 Today's Date: 06/10/2015    History of Present Illness Patient is a 81 year old female with history of Alzheimer disease, HLD, HTN, bilateral hip arthroplasty, RCC who was transferred from outside facility due to fall. Patient can't remember the event and denies any complaints except mild lower back pain. Reviewing records shows that patient had a mechanical fall that had CXR, CTH, pelvic xrays, CT cervical spine that showed pubic fx adn suggested CT scan of pelvis to rule out acute on chronic femur fracture. Ortho evaluated patient and advised no surgical intervention and admission to medicine for pain management.     PT Comments    Pt is now WBAT RLE. Pt able to ambulate 5 feet with RW today with Mod assist, +1 for close chair follow. Pt A&O x 1.  Follow Up Recommendations  SNF     Equipment Recommendations  Rolling walker with 5" wheels;3in1 (PT)    Recommendations for Other Services       Precautions / Restrictions Precautions Precautions: Fall Restrictions RLE Weight Bearing: Weight bearing as tolerated    Mobility  Bed Mobility         Supine to sit: Max assist     General bed mobility comments: Pt initiated movement well but was limited by pain once she started moving.  Transfers   Equipment used: Rolling walker (2 wheeled)   Sit to Stand: Mod assist         General transfer comment: verbal/tactile cues for sequencing  Ambulation/Gait Ambulation/Gait assistance: Mod assist;+2 safety/equipment Ambulation Distance (Feet): 7 Feet Assistive device: Rolling walker (2 wheeled) Gait Pattern/deviations: Antalgic;Step-to pattern;Decreased stride length Gait velocity: very slow   General Gait Details: Continuous verbal cues for sequencing and encouragement for further distance. mod assist for ambulation and +1 for close chair follow.   Stairs             Wheelchair Mobility    Modified Rankin (Stroke Patients Only)       Balance                                    Cognition Arousal/Alertness: Awake/alert Behavior During Therapy: WFL for tasks assessed/performed Overall Cognitive Status: No family/caregiver present to determine baseline cognitive functioning       Memory: Decreased short-term memory;Decreased recall of precautions              Exercises      General Comments        Pertinent Vitals/Pain Pain Assessment: Faces Faces Pain Scale: Hurts even more Pain Location: R groin Pain Descriptors / Indicators: Grimacing;Guarding;Moaning Pain Intervention(s): Monitored during session;Limited activity within patient's tolerance;Repositioned    Home Living                      Prior Function            PT Goals (current goals can now be found in the care plan section) Acute Rehab PT Goals PT Goal Formulation: Patient unable to participate in goal setting Time For Goal Achievement: 06/22/15 Potential to Achieve Goals: Good Progress towards PT goals: Progressing toward goals    Frequency  Min 3X/week    PT Plan Current plan remains appropriate    Co-evaluation             End of Session Equipment Utilized  During Treatment: Gait belt Activity Tolerance: Patient limited by pain Patient left: in chair;with call bell/phone within reach;with chair alarm set     Time: 3419-6222 PT Time Calculation (min) (ACUTE ONLY): 26 min  Charges:  $Gait Training: 8-22 mins $Therapeutic Activity: 8-22 mins                    G Codes:      Lorriane Shire 06/10/2015, 12:50 PM

## 2015-06-10 NOTE — Clinical Social Work Note (Signed)
CSW attempted to contact patient's daughter via phone.  CSW has left voicemail with patient's daughter; awaiting call return. CSW contacted [facility] [employer]) to confirm patient admitted from their facility.  CSW to continue to follow and assist with discharge planning needs. Full assessment to follow.  Lubertha Sayres, [place] Clinical Social Work Department Orthopedics [phone]) and Surgical [phone])

## 2015-06-10 NOTE — Clinical Social Work Placement (Signed)
   CLINICAL SOCIAL WORK PLACEMENT  NOTE  Date:  06/10/2015  Patient Details  Name: Kaitlin Rodriguez MRN: [id number] Date of Birth: 1928/03/15  Clinical Social Work is seeking post-discharge placement for this patient at the [place] level of care (*CSW will initial, date and re-position this form in  chart as items are completed):  Yes   Patient/family provided with [place] Work Department's list of facilities offering this level of care within the geographic area requested by the patient (or if unable, by the patient's family).  Yes   Patient/family informed of their freedom to choose among providers that offer the needed level of care, that participate in Medicare, Medicaid or managed care program needed by the patient, have an available bed and are willing to accept the patient.  Yes   Patient/family informed of [facility]'s ownership interest in [facility] and [facility], as well as of the fact that they are under no obligation to receive care at these facilities.  PASRR submitted to EDS on       PASRR number received on       Existing PASRR number confirmed on 06/10/15     FL2 transmitted to all facilities in geographic area requested by pt/family on 06/10/15     FL2 transmitted to all facilities within larger geographic area on       Patient informed that his/her managed care company has contracts with or will negotiate with certain facilities, including the following:            Patient/family informed of bed offers received.  Patient chooses bed at       Physician recommends and patient chooses bed at      Patient to be transferred to   on  .  Patient to be transferred to facility by       Patient family notified on   of transfer.  Name of family member notified:        PHYSICIAN       Additional Comment:    _______________________________________________ Dulcy Fanny, LCSW 06/10/2015, 12:51 PM

## 2015-06-11 LAB — BASIC METABOLIC PANEL
Anion gap: 7 (ref 5–15)
BUN: 35 mg/dL — ABNORMAL HIGH (ref 6–20)
CALCIUM: 8.8 mg/dL — AB (ref 8.9–10.3)
CHLORIDE: 107 mmol/L (ref 101–111)
CO2: 25 mmol/L (ref 22–32)
CREATININE: 0.91 mg/dL (ref 0.44–1.00)
GFR calc non Af Amer: 56 mL/min — ABNORMAL LOW (ref >60)
GLUCOSE: 114 mg/dL — AB (ref 65–99)
Potassium: 4.2 mmol/L (ref 3.5–5.1)
Sodium: 139 mmol/L (ref 135–145)

## 2015-06-11 LAB — CBC
HEMATOCRIT: 34.7 % — AB (ref 36.0–46.0)
HEMOGLOBIN: 11.4 g/dL — AB (ref 12.0–15.0)
MCH: 31.8 pg (ref 26.0–34.0)
MCHC: 32.9 g/dL (ref 30.0–36.0)
MCV: 96.7 fL (ref 78.0–100.0)
Platelets: 348 10*3/uL (ref 150–400)
RBC: 3.59 MIL/uL — ABNORMAL LOW (ref 3.87–5.11)
RDW: 14.1 % (ref 11.5–15.5)
WBC: 9.8 10*3/uL (ref 4.0–10.5)

## 2015-06-11 MED ORDER — HYDROCODONE-ACETAMINOPHEN 5-325 MG PO TABS: 1.0000 | ORAL_TABLET | ORAL | Status: DC | PRN

## 2015-06-11 MED ORDER — TRAMADOL HCL 50 MG PO TABS
50.0000 mg | ORAL_TABLET | Freq: Three times a day (TID) | ORAL | Status: DC
  Administered 2015-06-11: 50 mg via ORAL
  Filled 2015-06-11: qty 1

## 2015-06-11 MED ORDER — AMOXICILLIN 500 MG PO CAPS
500.0000 mg | ORAL_CAPSULE | Freq: Two times a day (BID) | ORAL | Status: DC
Start: 2015-06-11 — End: 2015-07-02

## 2015-06-11 MED ORDER — TRAMADOL HCL 50 MG PO TABS: 50.0000 mg | ORAL_TABLET | Freq: Three times a day (TID) | ORAL | Status: DC

## 2015-06-11 MED ORDER — OXYCODONE-ACETAMINOPHEN 5-325 MG PO TABS: 1.0000 | ORAL_TABLET | ORAL | Status: DC | PRN

## 2015-06-11 NOTE — Discharge Planning (Signed)
Patient will discharge today per MD order. Patient will discharge to: [facility] RN to call report prior to transportation to: [phone] Transportation: [facility]  CSW sent discharge summary to SNF for review.  Packet is complete.  RN, patient and family aware of discharge plans.  Nonnie Done, [place] [phone]  Psychiatric & Orthopedics (5N 1-16) Clinical Social Worker

## 2015-06-11 NOTE — Clinical Social Work Note (Signed)
CSW spoke with patient's daughter and provided bed offers.  Patient's daughter has narrowed the bed offers down to either [facility] (who has offered a private room) and [facility].  Daughter to research, as daughter is out of town, and daughter to call CSW will decision for placement.  Nonnie Done, LCSW [phone]  Psychiatric & Orthopedics (5N 1-8) Clinical Social Worker

## 2015-06-11 NOTE — Care Management Note (Signed)
Case Management Note  Patient Details  Name: Kaitlin Rodriguez MRN: [id number] Date of Birth: [birth year]  Subjective/Objective:                    Action/Plan:To be discharged to SNF today. Will sign off.    Expected Discharge Date:                  Expected Discharge Plan:  Skilled Nursing Facility  In-House Referral:  Clinical Social Work Passenger transport manager)  Discharge planning Services  CM Consult  Post Acute Care Choice:    Choice offered to:     DME Arranged:    DME Agency:     HH Arranged:    [place] Agency:     Status of Service:  Completed, signed off  Medicare Important Message Given:  Yes-second notification given Date Medicare IM Given:    Medicare IM give by:    Date Additional Medicare IM Given:    Additional Medicare Important Message give by:     If discussed at [place] of Stay Meetings, dates discussed:    Additional Comments:  Delrae Sawyers, RN 06/11/2015, 10:47 AM

## 2015-06-11 NOTE — Progress Notes (Signed)
Rept to Designer, multimedia at [employer]. Pt transferred via [facility] to [facility] without incident with no change in AM assessment.

## 2015-06-11 NOTE — Discharge Summary (Signed)
Physician Discharge Summary   Patient ID: Kaitlin Rodriguez MRN: [id number] DOB/AGE: 03/28/1928 81 y.o.  Admit date: 06/07/2015 Discharge date: 06/11/2015  Primary Care Physician:  Reymundo Poll, MD  Discharge Diagnoses:    . Fracture of right superior pubic ramus . Fall . enterococcus UTI (lower urinary tract infection) . Alzheimer's dementia . Closed right hip fracture . Hematuria  Consults:  None   Recommendations for Outpatient Follow-up:  Patient will need referral to urology for follow-up. Please repeat renal/bladder ultrasound in 2 weeks to ensure there is no bladder mass. Patient had renal ultrasound done which showed echogenic material in the dependent portion of the bladder likely debris or possible clot. Repeat renal and bladder ultrasound. She may need outpatient cystoscopy by urology.  Amoxicillin 500 mg twice a day for 7 days   Continue PT, OT, weightbearing as tolerated   TESTS THAT NEED FOLLOW-UP CBC, BMET per protocol   DIET: heart healthy diet    Allergies:   Allergies  Allergen Reactions  . Oxybutynin Rash  . Sulfa Antibiotics Other (See Comments)    Per MAR     Discharge Medications:   Medication List    TAKE these medications        acetaminophen 325 MG tablet  Commonly known as:  TYLENOL  Take 650 mg by mouth daily as needed for moderate pain (pain).     amoxicillin 500 MG capsule  Commonly known as:  AMOXIL  Take 1 capsule (500 mg total) by mouth 2 (two) times daily. X 7days     atorvastatin 40 MG tablet  Commonly known as:  LIPITOR  Take 40 mg by mouth at bedtime.     cholecalciferol 1000 UNITS tablet  Commonly known as:  VITAMIN D  Take 1,000 Units by mouth daily.     docusate sodium 100 MG capsule  Commonly known as:  COLACE  Take 100 mg by mouth 2 (two) times daily.     donepezil 10 MG tablet  Commonly known as:  ARICEPT  Take 10 mg by mouth at bedtime.     feeding supplement (ENSURE COMPLETE) Liqd  Take 237 mLs by  mouth 2 (two) times daily between meals.     HYDROcodone-acetaminophen 5-325 MG per tablet  Commonly known as:  NORCO/VICODIN  Take 1 tablet by mouth every 4 (four) hours as needed for severe pain.     ondansetron 4 MG tablet  Commonly known as:  ZOFRAN  Take 4 mg by mouth every 6 (six) hours as needed for nausea.     polyethylene glycol packet  Commonly known as:  MIRALAX / GLYCOLAX  Take 17 g by mouth daily. Mix in 8 oz of liquid and drink     PROCTOCORT 1 % Crea  Generic drug:  hydrocortisone  Apply 1 application topically daily as needed (hemorrhoids).     [place] Oint  Generic drug:  Petrolatum  Apply 1 application topically 3 (three) times daily. Apply to buttocks for skin protection     traMADol 50 MG tablet  Commonly known as:  ULTRAM  Take 1 tablet (50 mg total) by mouth 3 (three) times daily.     triamcinolone 0.025 % cream  Commonly known as:  KENALOG  Apply 1 application topically 2 (two) times daily. Apply to back and abdominal area twice daily for rash         Brief H and P: For complete details please refer to admission H and P, but in brief Patient  is a 81 year old female with history of Alzheimer disease, HLD, HTN, bilateral hip arthroplasty, RCC who was transferred from outside facility due to fall. Patient could not remember the event and denied any complaints except mild lower back pain. Reviewing records shows that patient had a mechanical fall that had CXR, CTH, pelvic xrays, CT cervical spine. Hip x-ray showed right pubic body and superior pubic ramus fracture, bilateral total hip arthroplasty , rule out acute on chronic femur fracture. Ortho evaluated patient and advised no surgical intervention and admission to medicine for pain management.   Hospital Course:   Fracture of right superior pubic ramus - Continue pain management, orthopedics was consulted and recommended conservative management, physical therapy, no surgical intervention - CT  of the right hip showed no acute femur fracture, confirmed pubic fracture  - PTOT evaluation-> skilled nursing facility    UTI (lower urinary tract infection) - Urine culture shows more than 100,000 colonies of enterococcus sensitive to ampicillin - IV Rocephin was discontinued, patient was placed on amoxicillin for 7 days to complete full course  Bilateral hip arthroplasty with right hip pain - CT of the right hip negative for acute on chronic femur fracture   Alzheimer's dementia - Currently appears to be at baseline, continue Aricept   Hematuria - Possibly due to enterococcus UTI - renal ultrasound showed no hydronephrosis, echogenic material in the dependent portion of urinary bladder likely debris or possible blood clot, follow-up ultrasound recommended to ensure resolution. If persists on follow-up ultrasound and neoplasm cannot be excluded. Outpatient follow-up with urology for a cystoscopy and further workup. Please repeat renal/bladder ultrasound in 2-3 weeks.  Day of Discharge BP 127/60 mmHg  Pulse 78  Temp(Src) 98.4 F (36.9 C) (Oral)  Resp 16  Ht 5' 1.5" (1.562 m)  Wt 45.36 kg (100 lb)  BMI 18.59 kg/m2  SpO2 98%  Physical Exam: General: Alert and awake oriented x3 not in any acute distress. HEENT: anicteric sclera, pupils reactive to light and accommodation CVS: S1-S2 clear no murmur rubs or gallops Chest: clear to auscultation bilaterally, no wheezing rales or rhonchi Abdomen: soft nontender, nondistended, normal bowel sounds Extremities: no cyanosis, clubbing or edema noted bilaterally Neuro: Cranial nerves II-XII intact, no focal neurological deficits   The results of significant diagnostics from this hospitalization (including imaging, microbiology, ancillary and laboratory) are listed below for reference.    LAB RESULTS: Basic Metabolic Panel:  Recent Labs Lab 06/10/15 0526 06/11/15 0500  NA 143 139  K 4.3 4.2  CL 111 107  CO2 25 25  GLUCOSE  105* 114*  BUN 34* 35*  CREATININE 1.03* 0.91  CALCIUM 8.9 8.8*   Liver Function Tests:  Recent Labs Lab 06/07/15 2254  AST 26  ALT 14  ALKPHOS 47  BILITOT 0.7  PROT 6.7  ALBUMIN 2.9*   No results for input(s): LIPASE, AMYLASE in the last 168 hours. No results for input(s): AMMONIA in the last 168 hours. CBC:  Recent Labs Lab 06/08/15 0535  06/10/15 0526 06/11/15 0500  WBC 13.1*  < > 7.2 9.8  NEUTROABS 9.3*  --   --   --   HGB 12.3  < > 12.2 11.4*  HCT 36.5  < > 37.9 34.7*  MCV 96.6  < > 97.7 96.7  PLT 310  < > 315 348  < > = values in this interval not displayed. Cardiac Enzymes: No results for input(s): CKTOTAL, CKMB, CKMBINDEX, TROPONINI in the last 168 hours. BNP: Invalid input(s):  POCBNP CBG: No results for input(s): GLUCAP in the last 168 hours.  Significant Diagnostic Studies:  Dg Chest 1 View  06/07/2015   CLINICAL DATA:  Fall  EXAM: CHEST  1 VIEW  COMPARISON:  08/27/2014  FINDINGS: The heart size and mediastinal contours are within normal limits. Both lungs are clear but hyperinflated which may suggest emphysema. The visualized skeletal structures are unremarkable.  IMPRESSION: No active disease.   Electronically Signed   By: Conchita Paris M.D.   On: 06/07/2015 08:16   Dg Lumbar Spine Complete  06/07/2015   CLINICAL DATA:  81 year old female with a history of fall. Right hip pain.  EXAM: LUMBAR SPINE - COMPLETE 4+ VIEW  COMPARISON:  None.  FINDINGS: Anterior view demonstrates mild right apex scoliotic curvature secondary to degenerative changes.  Vertebral bodies relatively aligned with no subluxation. Subtle anterolisthesis of L5 on S1. Subtle retrolisthesis of L3 on L4.  Vertebral body heights maintained.  No acute fracture line identified.  Osteopenia.  Degenerative disc disease is worst at the L4-L5 and L5-S1 levels.  Oblique images demonstrate no displaced pars defect.  Calcifications of the vasculature.  Surgical changes of the right abdomen.  Surgical  changes of bilateral total hip arthroplasty.  IMPRESSION: No radiographic evidence of acute fracture or malalignment of the lumbar spine.  Atherosclerosis.  Surgical changes of the abdomen and of bilateral hip total arthroplasty.  Signed,  Dulcy Fanny. Earleen Newport, DO  [facility]  [facility]   Electronically Signed   By: Corrie Mckusick D.O.   On: 06/07/2015 08:17   Ct Head Wo Contrast  06/07/2015   CLINICAL DATA:  Fall with laceration to the right eye.  Confusion.  EXAM: CT HEAD WITHOUT CONTRAST  CT CERVICAL SPINE WITHOUT CONTRAST  TECHNIQUE: Multidetector CT imaging of the head and cervical spine was performed following the standard protocol without intravenous contrast. Multiplanar CT image reconstructions of the cervical spine were also generated.  COMPARISON:  Head CT 08/27/2014  FINDINGS: CT HEAD FINDINGS  Motion degraded study.  Skull and Sinuses:There is soft tissue swelling and hematoma over the right scalp without underlying fracture.  Orbits: No traumatic finding  Brain: No evidence of acute infarction, hemorrhage, hydrocephalus, or mass lesion/mass effect.  There is generalized cortical atrophy which is stable from 2015. Extensive chronic small vessel disease with ischemic gliosis throughout the bilateral cerebral white matter. Chronic remote left occipital cortex infarction.  CT CERVICAL SPINE FINDINGS  No evidence of acute fracture or traumatic malalignment. Mild C3-4 and C4-5 anterolisthesis is chronic based on brain MRI 02/21/2014.  Diffuse degenerative disc and facet disease, which accounts for the above anterolisthesis. Disc narrowing is advanced in the lower cervical spine, especially at C5-6 and C6-7.  IMPRESSION: 1. Motion degraded study without acute intracranial or cervical spine finding. 2. Right frontal scalp hematoma without fracture. 3. Atrophy and extensive chronic small vessel disease.   Electronically Signed   By: Monte Fantasia M.D.   On:  06/07/2015 08:04   Ct Cervical Spine Wo Contrast  06/07/2015   CLINICAL DATA:  Fall with laceration to the right eye.  Confusion.  EXAM: CT HEAD WITHOUT CONTRAST  CT CERVICAL SPINE WITHOUT CONTRAST  TECHNIQUE: Multidetector CT imaging of the head and cervical spine was performed following the standard protocol without intravenous contrast. Multiplanar CT image reconstructions of the cervical spine were also generated.  COMPARISON:  Head CT 08/27/2014  FINDINGS: CT HEAD FINDINGS  Motion degraded study.  Skull and Sinuses:There  is soft tissue swelling and hematoma over the right scalp without underlying fracture.  Orbits: No traumatic finding  Brain: No evidence of acute infarction, hemorrhage, hydrocephalus, or mass lesion/mass effect.  There is generalized cortical atrophy which is stable from 2015. Extensive chronic small vessel disease with ischemic gliosis throughout the bilateral cerebral white matter. Chronic remote left occipital cortex infarction.  CT CERVICAL SPINE FINDINGS  No evidence of acute fracture or traumatic malalignment. Mild C3-4 and C4-5 anterolisthesis is chronic based on brain MRI 02/21/2014.  Diffuse degenerative disc and facet disease, which accounts for the above anterolisthesis. Disc narrowing is advanced in the lower cervical spine, especially at C5-6 and C6-7.  IMPRESSION: 1. Motion degraded study without acute intracranial or cervical spine finding. 2. Right frontal scalp hematoma without fracture. 3. Atrophy and extensive chronic small vessel disease.   Electronically Signed   By: Monte Fantasia M.D.   On: 06/07/2015 08:04   US Abdomen Complete  06/08/2015   CLINICAL DATA:  Hematuria after fall.  EXAM: ULTRASOUND ABDOMEN COMPLETE  COMPARISON:  None.  FINDINGS: Gallbladder: Cholelithiasis is noted with the largest stone measuring 9 mm. No gallbladder wall thickening or pericholecystic fluid is noted. No sonographic Murphy's sign is noted.  Common bile duct: Diameter: 6.2 mm  proximally but 8.3 mm distally ; correlation with liver function tests is recommended to rule out obstruction.  Liver: 1.7 cm simple cyst is noted in right hepatic lobe. Within normal limits in parenchymal echogenicity.  IVC: No abnormality visualized.  Pancreas: Visualized portion unremarkable.  Spleen: Size and appearance within normal limits.  Status post right nephrectomy.  Left Kidney: Length: 10.7 cm. Echogenicity within normal limits. No mass or hydronephrosis visualized.  Abdominal aorta: No aneurysm visualized.  Other findings: Debris is noted in the urinary bladder lumen. Left ureteral jet is visualized.  IMPRESSION: Cholelithiasis without evidence cholecystitis. Status post right nephrectomy. No hydronephrosis or renal obstruction is noted involving the left kidney.   Electronically Signed   By: Marijo Conception, M.D.   On: 06/08/2015 17:31   US Pelvis Limited  06/08/2015   CLINICAL DATA:  Hematuria after fall.  EXAM: LIMITED ULTRASOUND OF PELVIS  TECHNIQUE: Limited transabdominal ultrasound examination of the pelvis was performed.  COMPARISON:  None.  FINDINGS: Status post hysterectomy. Echogenic material seen in the dependent portion of the urinary bladder which most likely represents debris or possibly clot. Left ureteral jet is visualized. Patient is status post right nephrectomy.  IMPRESSION: Echogenic material seen in dependent portion of urinary bladder which most likely represents debris or possibly blood clot. Followup ultrasound is recommended to ensure resolution ; if it persists on followup ultrasound, then neoplasm cannot be excluded.   Electronically Signed   By: Marijo Conception, M.D.   On: 06/08/2015 17:38   Ct Hip Right Wo Contrast  06/08/2015   CLINICAL DATA:  RIGHT hip fracture.  Fall 06/07/2015.  EXAM: CT OF THE RIGHT HIP WITHOUT CONTRAST  TECHNIQUE: Multidetector CT imaging of the right hip was performed according to the standard protocol. Multiplanar CT image reconstructions were  also generated.  COMPARISON:  07/22/2014.  Radiographs 12/04/2014.  FINDINGS: Acute nondisplaced LEFT obturator ring fractures are present. These appear at the root of the RIGHT superior pubic ramus and parasymphyseal RIGHT pubis. Compared to recent plain film 06/06/2014, there is little interval change in the appearance of the RIGHT proximal femur. Ossified callus is present along the anterior aspect of the RIGHT greater trochanter which has developed since  07/22/2014 and is consistent with healing of an interval fracture. Posteriorly, of the greater trochanteric fracture plane remains visible. There is no loosening of the femoral or acetabular components. Pre-existing acetabula protrusio is present. The RIGHT iliac bone appears normal. Iliofemoral atherosclerosis.  Referencing the recent prior plain films, there is no lesser trochanter fracture, as suggested by lucency at the base of the lesser trochanter. This artifactual appearance is probably produced by periosteal calcification associated with the greater trochanter fracture.  IMPRESSION: 1. Acute or subacute RIGHT obturator ring fractures, nondisplaced. 2. Intact RIGHT hip arthroplasty components. 3. Healing chronic periprosthetic RIGHT greater trochanter fracture.   Electronically Signed   By: Dereck Ligas M.D.   On: 06/08/2015 12:17   Dg Hip Unilat With Pelvis 2-3 Views Right  06/07/2015   CLINICAL DATA:  Fall with hip pain.  Initial encounter.  EXAM: DG HIP (WITH OR WITHOUT PELVIS) 2-3V RIGHT  COMPARISON:  12/04/2014  FINDINGS: Bilateral total hip arthroplasty. There is irregularity at the base of the right greater trochanter where fracture was seen on comparison study March 2016. Lucency along the base of the lesser trochanter on the right, not seen previously. No indication of hardware loosening or femoral shaft fracture.  There is a comminuted fracture of the right pubic body and superior pubic ramus.  The left hip prosthesis appears intact and  located.  IMPRESSION: 1. Right pubic body and superior pubic ramus fracture. 2. Bilateral total hip arthroplasty. Lucency through the bases of the right greater and lesser trochanters which is likely related to fracture seen 12/04/2014. Recommend CT to exclude acute on chronic femur fracture.   Electronically Signed   By: Monte Fantasia M.D.   On: 06/07/2015 08:17    2D ECHO:   Disposition and Follow-up:     Discharge Instructions    Diet - low sodium heart healthy    Complete by:  As directed      Increase activity slowly    Complete by:  As directed             DISPOSITION: SNF   DISCHARGE FOLLOW-UP Follow-up Information    Follow up with Reymundo Poll, MD. Schedule an appointment as soon as possible for a visit in 2 weeks.   Specialty:  Family Medicine   Why:  for hospital follow-up   Contact information:   [address] [place] [address] [phone]        Time spent on Discharge: 35 mins   Signed:   RAI,RIPUDEEP M.D. [facility] 06/11/2015, 10:45 AM Pager: [phone]

## 2015-06-11 NOTE — Progress Notes (Signed)
PT Cancellation Note  Patient Details Name: KEYON WINNICK MRN: [id number] DOB: 01/17/1928   Cancelled Treatment:    Reason Eval/Treat Not Completed: Other (comment); Per RN patient just back to bed and ambulance transport called for transport to SNF.  Will defer tx due to pending D/C.   WYNN,CYNDI 06/11/2015, 2:09 PM  Magda Kiel, [place] 06/11/2015

## 2015-06-11 NOTE — Clinical Social Work Placement (Signed)
   CLINICAL SOCIAL WORK PLACEMENT  NOTE  Date:  06/11/2015  Patient Details  Name: Kaitlin Rodriguez MRN: [id number] Date of Birth: 06/01/1928  Clinical Social Work is seeking post-discharge placement for this patient at the [place] level of care (*CSW will initial, date and re-position this form in  chart as items are completed):  Yes   Patient/family provided with [facility] Work Department's list of facilities offering this level of care within the geographic area requested by the patient (or if unable, by the patient's family).  Yes   Patient/family informed of their freedom to choose among providers that offer the needed level of care, that participate in Medicare, Medicaid or managed care program needed by the patient, have an available bed and are willing to accept the patient.  Yes   Patient/family informed of [facility]'s ownership interest in [facility] and [facility], as well as of the fact that they are under no obligation to receive care at these facilities.  PASRR submitted to EDS on       PASRR number received on       Existing PASRR number confirmed on 06/10/15     FL2 transmitted to all facilities in geographic area requested by pt/family on 06/10/15     FL2 transmitted to all facilities within larger geographic area on       Patient informed that his/her managed care company has contracts with or will negotiate with certain facilities, including the following:        Yes   Patient/family informed of bed offers received.  Patient chooses bed at [facility]     Physician recommends and patient chooses bed at      Patient to be transferred to [facility] on 06/11/15.  Patient to be transferred to facility by [facility]     Patient family notified on 06/11/15 of transfer.  Name of family member notified:  daughter [name]     PHYSICIAN Please prepare priority discharge summary, including  medications     Additional Comment:    _______________________________________________ Dulcy Fanny, LCSW 06/11/2015, 12:36 PM

## 2015-06-13 ENCOUNTER — Non-Acute Institutional Stay (SKILLED_NURSING_FACILITY): Payer: Medicare Other | Admitting: Internal Medicine

## 2015-06-13 ENCOUNTER — Encounter: Payer: Self-pay | Admitting: Internal Medicine

## 2015-06-13 DIAGNOSIS — F0280 Dementia in other diseases classified elsewhere without behavioral disturbance: Secondary | ICD-10-CM | POA: Diagnosis not present

## 2015-06-13 DIAGNOSIS — S32511D Fracture of superior rim of right pubis, subsequent encounter for fracture with routine healing: Secondary | ICD-10-CM | POA: Diagnosis not present

## 2015-06-13 DIAGNOSIS — G309 Alzheimer's disease, unspecified: Secondary | ICD-10-CM | POA: Diagnosis not present

## 2015-06-13 DIAGNOSIS — N390 Urinary tract infection, site not specified: Secondary | ICD-10-CM

## 2015-06-13 DIAGNOSIS — R319 Hematuria, unspecified: Secondary | ICD-10-CM

## 2015-06-13 DIAGNOSIS — E559 Vitamin D deficiency, unspecified: Secondary | ICD-10-CM

## 2015-06-13 DIAGNOSIS — E785 Hyperlipidemia, unspecified: Secondary | ICD-10-CM | POA: Insufficient documentation

## 2015-06-13 NOTE — Assessment & Plan Note (Signed)
SNF - stable , cont aricept

## 2015-06-13 NOTE — Assessment & Plan Note (Signed)
SNF - cont Vit D 1000 u daily

## 2015-06-13 NOTE — Assessment & Plan Note (Signed)
SNF - cont lipiotr 40 mg

## 2015-06-13 NOTE — Progress Notes (Signed)
MRN: [id number] Name: Kaitlin Rodriguez  Sex: female Age: 81 y.o. DOB: 1928-06-22  [place] #: [facility] Facility/Room:108 Level Of Care: SNF Carolena Fairbank: Inocencio Homes D Emergency Contacts: Extended Emergency Contact Information Primary Emergency Contact: Young,Kelly Address: [address]          [place] [address] [place] of [place] Phone: [phone] Relation: Daughter Secondary Emergency Contact: Rozann Lesches [place] of [place] Mobile Phone: [phone] Relation: Daughter  Code Status:   Allergies: Oxybutynin and Sulfa antibiotics  Chief Complaint  Patient presents with  . New Admit To SNF    HPI: Patient is 81 y.o. female with history of Alzheimer disease, HLD, HTN, bilateral hip arthroplasty, RCC who was transferred from outside facility due to fall. Patient could not remember the event and denied any complaints except mild lower back pain. Reviewing records shows that patient had a mechanical fall that had CXR, CTH, pelvic xrays, CT cervical spine. Hip x-ray showed right pubic body and superior pubic ramus fracture, bilateral total hip arthroplasty , rule out acute on chronic femur fracture. Ortho evaluated patient and advised no surgical intervention and pt was admitted from 9/10-14 for pain management.. Pt is now admitted to SNF for OT/PT. While at SNF pt will be followed for HLD, tx with lipitor, dementia, tx with aricept and Vit d def tx with Vit D supplement.  Past Medical History  Diagnosis Date  . Hypertension   . Hyperlipidemia   . Complication of anesthesia     "didn't wake up for a long time, pale, hypotension; got psychotic after one of her hip ORs in ~ 2010"  . Positive TB test     "father passed away from TB; she's negative/CXR"  . History of blood transfusion 2004    "related to hip replacement"  . Blood type, Rh negative   . Osteoarthritis     "severe in her hands" (12/04/2014)  . Alzheimer's dementia     "50% loss"  . Cancer of kidney    . Acute pancreatitis hospitalized 07/2014    Past Surgical History  Procedure Laterality Date  . Thyroid surgery  1960's    "took out tumor"  . Joint replacement    . Total hip arthroplasty Bilateral 2004-2010  . Abdominal hysterectomy    . Nephrectomy  ~ 2011    "cancer; it was contained"  . Tonsillectomy    . Breast lumpectomy      "benign"  . Cataract extraction w/ intraocular lens implant      "? side"      Medication List       This list is accurate as of: 06/13/15 11:59 PM.  Always use your most recent med list.               acetaminophen 325 MG tablet  Commonly known as:  TYLENOL  Take 650 mg by mouth daily as needed for moderate pain (pain).     amoxicillin 500 MG capsule  Commonly known as:  AMOXIL  Take 1 capsule (500 mg total) by mouth 2 (two) times daily. X 7days     atorvastatin 40 MG tablet  Commonly known as:  LIPITOR  Take 40 mg by mouth at bedtime.     cholecalciferol 1000 UNITS tablet  Commonly known as:  VITAMIN D  Take 1,000 Units by mouth daily.     docusate sodium 100 MG capsule  Commonly known as:  COLACE  Take 100 mg by mouth 2 (two) times daily.  donepezil 10 MG tablet  Commonly known as:  ARICEPT  Take 10 mg by mouth at bedtime.     feeding supplement (ENSURE COMPLETE) Liqd  Take 237 mLs by mouth 2 (two) times daily between meals.     HYDROcodone-acetaminophen 5-325 MG per tablet  Commonly known as:  NORCO/VICODIN  Take 1 tablet by mouth every 4 (four) hours as needed for severe pain.     ondansetron 4 MG tablet  Commonly known as:  ZOFRAN  Take 4 mg by mouth every 6 (six) hours as needed for nausea.     polyethylene glycol packet  Commonly known as:  MIRALAX / GLYCOLAX  Take 17 g by mouth daily. Mix in 8 oz of liquid and drink     PROCTOCORT 1 % Crea  Generic drug:  hydrocortisone  Apply 1 application topically daily as needed (hemorrhoids).     [place] Oint  Generic drug:  Petrolatum  Apply 1  application topically 3 (three) times daily. Apply to buttocks for skin protection     traMADol 50 MG tablet  Commonly known as:  ULTRAM  Take 1 tablet (50 mg total) by mouth 3 (three) times daily.     triamcinolone 0.025 % cream  Commonly known as:  KENALOG  Apply 1 application topically 2 (two) times daily. Apply to back and abdominal area twice daily for rash        No orders of the defined types were placed in this encounter.    Immunization History  Administered Date(s) Administered  . Influenza-Unspecified 07/28/2014  . Pneumococcal-Unspecified 07/28/2014  . Tdap 06/07/2015    Social History  Substance Use Topics  . Smoking status: Never Smoker   . Smokeless tobacco: Never Used  . Alcohol Use: No    Family history is  + pulmonary TB  Review of Systems  DATA OBTAINED: from patient, nurse GENERAL:  no fevers, fatigue, appetite changes SKIN: No itching, rash or wounds EYES: No eye pain, redness, discharge EARS: No earache, tinnitus, change in hearing NOSE: No congestion, drainage or bleeding  MOUTH/THROAT: No mouth or tooth pain, No sore throat RESPIRATORY: No cough, wheezing, SOB CARDIAC: No chest pain, palpitations, lower extremity edema  GI: No abdominal pain, No N/V/D or constipation, No heartburn or reflux  GU: No dysuria, frequency or urgency, or incontinence  MUSCULOSKELETAL: No unrelieved bone/joint pain NEUROLOGIC: No headache, dizziness or focal weakness PSYCHIATRIC: No c/o anxiety or sadness   Filed Vitals:   06/13/15 1308  BP: 107/55  Pulse: 70  Temp: 96.8 F (36 C)  Resp: 20    SpO2 Readings from Last 1 Encounters:  06/11/15 100%        Physical Exam  GENERAL APPEARANCE: Alert, conversant,  No acute distress.  SKIN: No diaphoresis rash HEAD: Normocephalic, atraumatic  EYES: Conjunctiva/lids clear. Pupils round, reactive. EOMs intact.  EARS: External exam WNL, canals clear. Hearing grossly normal.  NOSE: No deformity or discharge.   MOUTH/THROAT: Lips w/o lesions  RESPIRATORY: Breathing is even, unlabored. Lung sounds are clear   CARDIOVASCULAR: Heart RRR no murmurs, rubs or gallops. No peripheral edema.   GASTROINTESTINAL: Abdomen is soft, non-tender, not distended w/ normal bowel sounds. GENITOURINARY: Bladder non tender, not distended  MUSCULOSKELETAL: No abnormal joints or musculature NEUROLOGIC:  Cranial nerves 2-12 grossly intact. Moves all extremities  PSYCHIATRIC: Mood and affect appropriate to situation with dementia, no behavioral issues  Patient Active Problem List   Diagnosis Date Noted  . Hyperlipidemia 06/13/2015  . Vitamin  D deficiency 06/13/2015  . Hematuria 06/08/2015  . Fracture of right superior pubic ramus 06/07/2015  . Fall 06/07/2015  . UTI (lower urinary tract infection) 12/04/2014  . Closed right hip fracture 12/04/2014  . Alzheimer's dementia 12/04/2014    CBC    Component Value Date/Time   WBC 9.8 06/11/2015 0500   RBC 3.59* 06/11/2015 0500   HGB 11.4* 06/11/2015 0500   HCT 34.7* 06/11/2015 0500   PLT 348 06/11/2015 0500   MCV 96.7 06/11/2015 0500   LYMPHSABS 2.7 06/08/2015 0535   MONOABS 1.1* 06/08/2015 0535   EOSABS 0.0 06/08/2015 0535   BASOSABS 0.0 06/08/2015 0535    CMP     Component Value Date/Time   NA 139 06/11/2015 0500   K 4.2 06/11/2015 0500   CL 107 06/11/2015 0500   CO2 25 06/11/2015 0500   GLUCOSE 114* 06/11/2015 0500   BUN 35* 06/11/2015 0500   CREATININE 0.91 06/11/2015 0500   CALCIUM 8.8* 06/11/2015 0500   PROT 6.7 06/07/2015 2254   ALBUMIN 2.9* 06/07/2015 2254   AST 26 06/07/2015 2254   ALT 14 06/07/2015 2254   ALKPHOS 47 06/07/2015 2254   BILITOT 0.7 06/07/2015 2254   GFRNONAA 56* 06/11/2015 0500   GFRAA >60 06/11/2015 0500    No results found for: HGBA1C   Dg Chest 1 View  06/07/2015   CLINICAL DATA:  Fall  EXAM: CHEST  1 VIEW  COMPARISON:  08/27/2014  FINDINGS: The heart size and mediastinal contours are within normal limits. Both lungs  are clear but hyperinflated which may suggest emphysema. The visualized skeletal structures are unremarkable.  IMPRESSION: No active disease.   Electronically Signed   By: Conchita Paris M.D.   On: 06/07/2015 08:16   Dg Lumbar Spine Complete  06/07/2015   CLINICAL DATA:  81 year old female with a history of fall. Right hip pain.  EXAM: LUMBAR SPINE - COMPLETE 4+ VIEW  COMPARISON:  None.  FINDINGS: Anterior view demonstrates mild right apex scoliotic curvature secondary to degenerative changes.  Vertebral bodies relatively aligned with no subluxation. Subtle anterolisthesis of L5 on S1. Subtle retrolisthesis of L3 on L4.  Vertebral body heights maintained.  No acute fracture line identified.  Osteopenia.  Degenerative disc disease is worst at the L4-L5 and L5-S1 levels.  Oblique images demonstrate no displaced pars defect.  Calcifications of the vasculature.  Surgical changes of the right abdomen.  Surgical changes of bilateral total hip arthroplasty.  IMPRESSION: No radiographic evidence of acute fracture or malalignment of the lumbar spine.  Atherosclerosis.  Surgical changes of the abdomen and of bilateral hip total arthroplasty.  Signed,  Dulcy Fanny. Earleen Newport, DO  [facility]  [facility]   Electronically Signed   By: Corrie Mckusick D.O.   On: 06/07/2015 08:17   Ct Head Wo Contrast  06/07/2015   CLINICAL DATA:  Fall with laceration to the right eye.  Confusion.  EXAM: CT HEAD WITHOUT CONTRAST  CT CERVICAL SPINE WITHOUT CONTRAST  TECHNIQUE: Multidetector CT imaging of the head and cervical spine was performed following the standard protocol without intravenous contrast. Multiplanar CT image reconstructions of the cervical spine were also generated.  COMPARISON:  Head CT 08/27/2014  FINDINGS: CT HEAD FINDINGS  Motion degraded study.  Skull and Sinuses:There is soft tissue swelling and hematoma over the right scalp without underlying fracture.  Orbits: No traumatic  finding  Brain: No evidence of acute infarction, hemorrhage, hydrocephalus, or mass lesion/mass effect.  There is  generalized cortical atrophy which is stable from 2015. Extensive chronic small vessel disease with ischemic gliosis throughout the bilateral cerebral white matter. Chronic remote left occipital cortex infarction.  CT CERVICAL SPINE FINDINGS  No evidence of acute fracture or traumatic malalignment. Mild C3-4 and C4-5 anterolisthesis is chronic based on brain MRI 02/21/2014.  Diffuse degenerative disc and facet disease, which accounts for the above anterolisthesis. Disc narrowing is advanced in the lower cervical spine, especially at C5-6 and C6-7.  IMPRESSION: 1. Motion degraded study without acute intracranial or cervical spine finding. 2. Right frontal scalp hematoma without fracture. 3. Atrophy and extensive chronic small vessel disease.   Electronically Signed   By: Monte Fantasia M.D.   On: 06/07/2015 08:04   Ct Cervical Spine Wo Contrast  06/07/2015   CLINICAL DATA:  Fall with laceration to the right eye.  Confusion.  EXAM: CT HEAD WITHOUT CONTRAST  CT CERVICAL SPINE WITHOUT CONTRAST  TECHNIQUE: Multidetector CT imaging of the head and cervical spine was performed following the standard protocol without intravenous contrast. Multiplanar CT image reconstructions of the cervical spine were also generated.  COMPARISON:  Head CT 08/27/2014  FINDINGS: CT HEAD FINDINGS  Motion degraded study.  Skull and Sinuses:There is soft tissue swelling and hematoma over the right scalp without underlying fracture.  Orbits: No traumatic finding  Brain: No evidence of acute infarction, hemorrhage, hydrocephalus, or mass lesion/mass effect.  There is generalized cortical atrophy which is stable from 2015. Extensive chronic small vessel disease with ischemic gliosis throughout the bilateral cerebral white matter. Chronic remote left occipital cortex infarction.  CT CERVICAL SPINE FINDINGS  No evidence of acute  fracture or traumatic malalignment. Mild C3-4 and C4-5 anterolisthesis is chronic based on brain MRI 02/21/2014.  Diffuse degenerative disc and facet disease, which accounts for the above anterolisthesis. Disc narrowing is advanced in the lower cervical spine, especially at C5-6 and C6-7.  IMPRESSION: 1. Motion degraded study without acute intracranial or cervical spine finding. 2. Right frontal scalp hematoma without fracture. 3. Atrophy and extensive chronic small vessel disease.   Electronically Signed   By: Monte Fantasia M.D.   On: 06/07/2015 08:04   US Abdomen Complete  06/08/2015   CLINICAL DATA:  Hematuria after fall.  EXAM: ULTRASOUND ABDOMEN COMPLETE  COMPARISON:  None.  FINDINGS: Gallbladder: Cholelithiasis is noted with the largest stone measuring 9 mm. No gallbladder wall thickening or pericholecystic fluid is noted. No sonographic Murphy's sign is noted.  Common bile duct: Diameter: 6.2 mm proximally but 8.3 mm distally ; correlation with liver function tests is recommended to rule out obstruction.  Liver: 1.7 cm simple cyst is noted in right hepatic lobe. Within normal limits in parenchymal echogenicity.  IVC: No abnormality visualized.  Pancreas: Visualized portion unremarkable.  Spleen: Size and appearance within normal limits.  Status post right nephrectomy.  Left Kidney: Length: 10.7 cm. Echogenicity within normal limits. No mass or hydronephrosis visualized.  Abdominal aorta: No aneurysm visualized.  Other findings: Debris is noted in the urinary bladder lumen. Left ureteral jet is visualized.  IMPRESSION: Cholelithiasis without evidence cholecystitis. Status post right nephrectomy. No hydronephrosis or renal obstruction is noted involving the left kidney.   Electronically Signed   By: Marijo Conception, M.D.   On: 06/08/2015 17:31   US Pelvis Limited  06/08/2015   CLINICAL DATA:  Hematuria after fall.  EXAM: LIMITED ULTRASOUND OF PELVIS  TECHNIQUE: Limited transabdominal ultrasound  examination of the pelvis was performed.  COMPARISON:  None.  FINDINGS: Status post hysterectomy. Echogenic material seen in the dependent portion of the urinary bladder which most likely represents debris or possibly clot. Left ureteral jet is visualized. Patient is status post right nephrectomy.  IMPRESSION: Echogenic material seen in dependent portion of urinary bladder which most likely represents debris or possibly blood clot. Followup ultrasound is recommended to ensure resolution ; if it persists on followup ultrasound, then neoplasm cannot be excluded.   Electronically Signed   By: Marijo Conception, M.D.   On: 06/08/2015 17:38   Ct Hip Right Wo Contrast  06/08/2015   CLINICAL DATA:  RIGHT hip fracture.  Fall 06/07/2015.  EXAM: CT OF THE RIGHT HIP WITHOUT CONTRAST  TECHNIQUE: Multidetector CT imaging of the right hip was performed according to the standard protocol. Multiplanar CT image reconstructions were also generated.  COMPARISON:  07/22/2014.  Radiographs 12/04/2014.  FINDINGS: Acute nondisplaced LEFT obturator ring fractures are present. These appear at the root of the RIGHT superior pubic ramus and parasymphyseal RIGHT pubis. Compared to recent plain film 06/06/2014, there is little interval change in the appearance of the RIGHT proximal femur. Ossified callus is present along the anterior aspect of the RIGHT greater trochanter which has developed since 07/22/2014 and is consistent with healing of an interval fracture. Posteriorly, of the greater trochanteric fracture plane remains visible. There is no loosening of the femoral or acetabular components. Pre-existing acetabula protrusio is present. The RIGHT iliac bone appears normal. Iliofemoral atherosclerosis.  Referencing the recent prior plain films, there is no lesser trochanter fracture, as suggested by lucency at the base of the lesser trochanter. This artifactual appearance is probably produced by periosteal calcification associated with the  greater trochanter fracture.  IMPRESSION: 1. Acute or subacute RIGHT obturator ring fractures, nondisplaced. 2. Intact RIGHT hip arthroplasty components. 3. Healing chronic periprosthetic RIGHT greater trochanter fracture.   Electronically Signed   By: Dereck Ligas M.D.   On: 06/08/2015 12:17   Dg Hip Unilat With Pelvis 2-3 Views Right  06/07/2015   CLINICAL DATA:  Fall with hip pain.  Initial encounter.  EXAM: DG HIP (WITH OR WITHOUT PELVIS) 2-3V RIGHT  COMPARISON:  12/04/2014  FINDINGS: Bilateral total hip arthroplasty. There is irregularity at the base of the right greater trochanter where fracture was seen on comparison study March 2016. Lucency along the base of the lesser trochanter on the right, not seen previously. No indication of hardware loosening or femoral shaft fracture.  There is a comminuted fracture of the right pubic body and superior pubic ramus.  The left hip prosthesis appears intact and located.  IMPRESSION: 1. Right pubic body and superior pubic ramus fracture. 2. Bilateral total hip arthroplasty. Lucency through the bases of the right greater and lesser trochanters which is likely related to fracture seen 12/04/2014. Recommend CT to exclude acute on chronic femur fracture.   Electronically Signed   By: Monte Fantasia M.D.   On: 06/07/2015 08:17    Not all labs, radiology exams or other studies done during hospitalization come through on my EPIC note; however they are reviewed by me.    Assessment and Plan  Fracture of right superior pubic ramus Continue pain management, orthopedics was consulted and recommended conservative management, physical therapy, no surgical intervention - CT of the right hip showed no acute femur fracture, confirmed pubic fracture  SNF - OT/PT  UTI (lower urinary tract infection) Urine culture shows more than 100,000 colonies of enterococcus sensitive to ampicillin; rocephin was d/c SNF -  Start amoxicillin for 7 days to complete full  course  Hematuria Possibly due to enterococcus UTI - renal ultrasound showed no hydronephrosis, echogenic material in the dependent portion of urinary bladder likely debris or possible blood clot, follow-up ultrasound recommended to ensure resolution. If persists on follow-up ultrasound and neoplasm cannot be excluded.SNF - srt up Outpatient follow-up with urology for a cystoscopy and further workup; they can order repeat renal/bladder ultrasound in 2-3 weeks.  Alzheimer's dementia SNF - stable , cont aricept  Hyperlipidemia SNF - cont lipiotr 40 mg  Vitamin D deficiency SNF - cont Vit D 1000 u daily   Time spent > 35 min;> 50% of time with patient was spent reviewing records, labs, tests and studies, counseling and developing plan of care  Hennie Duos, MD

## 2015-06-13 NOTE — Assessment & Plan Note (Signed)
Urine culture shows more than 100,000 colonies of enterococcus sensitive to ampicillin; rocephin was d/c SNF -  Start amoxicillin for 7 days to complete full course

## 2015-06-13 NOTE — Assessment & Plan Note (Signed)
Continue pain management, orthopedics was consulted and recommended conservative management, physical therapy, no surgical intervention - CT of the right hip showed no acute femur fracture, confirmed pubic fracture  SNF - OT/PT

## 2015-06-13 NOTE — Assessment & Plan Note (Signed)
Possibly due to enterococcus UTI - renal ultrasound showed no hydronephrosis, echogenic material in the dependent portion of urinary bladder likely debris or possible blood clot, follow-up ultrasound recommended to ensure resolution. If persists on follow-up ultrasound and neoplasm cannot be excluded.SNF - srt up Outpatient follow-up with urology for a cystoscopy and further workup; they can order repeat renal/bladder ultrasound in 2-3 weeks.

## 2015-07-01 ENCOUNTER — Encounter: Payer: Self-pay | Admitting: Internal Medicine

## 2015-07-01 ENCOUNTER — Non-Acute Institutional Stay (SKILLED_NURSING_FACILITY): Payer: Medicare Other | Admitting: Internal Medicine

## 2015-07-01 DIAGNOSIS — N390 Urinary tract infection, site not specified: Secondary | ICD-10-CM

## 2015-07-01 DIAGNOSIS — E559 Vitamin D deficiency, unspecified: Secondary | ICD-10-CM

## 2015-07-01 DIAGNOSIS — W19XXXS Unspecified fall, sequela: Secondary | ICD-10-CM

## 2015-07-01 DIAGNOSIS — F0280 Dementia in other diseases classified elsewhere without behavioral disturbance: Secondary | ICD-10-CM | POA: Diagnosis not present

## 2015-07-01 DIAGNOSIS — E785 Hyperlipidemia, unspecified: Secondary | ICD-10-CM

## 2015-07-01 DIAGNOSIS — S32511D Fracture of superior rim of right pubis, subsequent encounter for fracture with routine healing: Secondary | ICD-10-CM | POA: Diagnosis not present

## 2015-07-01 DIAGNOSIS — G309 Alzheimer's disease, unspecified: Secondary | ICD-10-CM | POA: Diagnosis not present

## 2015-07-01 NOTE — Progress Notes (Signed)
MRN: [id number] Name: Kaitlin Rodriguez  Sex: female Age: 81 y.o. DOB: 1927/10/12  [place] #: [facility] Facility/Room:108 Level Of Care: SNF Provider: Inocencio Homes D Emergency Contacts: Extended Emergency Contact Information Primary Emergency Contact: Young,Kelly Address: [address]          [place] [address] Johnnette Litter of [place] Phone: [phone] Relation: Daughter Secondary Emergency Contact: Rozann Lesches [place] of [place] Mobile Phone: [phone] Relation: Daughter  Code Status:   Allergies: Oxybutynin and Sulfa antibiotics  Chief Complaint  Patient presents with  . Discharge Note    HPI: Patient is 81 y.o. female with history of Alzheimer disease, HLD, HTN, bilateral hip arthroplasty, who sustained right pubic body and superior pubic ramus fracture, ortho did not rec surgery. Pt was admitted to SNF for OT/PT. Pt is now ready to be discharged to home which is [facility]. Past Medical History  Diagnosis Date  . Hypertension   . Hyperlipidemia   . Complication of anesthesia     "didn't wake up for a long time, pale, hypotension; got psychotic after one of her hip ORs in ~ 2010"  . Positive TB test     "father passed away from TB; she's negative/CXR"  . History of blood transfusion 2004    "related to hip replacement"  . Blood type, Rh negative   . Osteoarthritis     "severe in her hands" (12/04/2014)  . Alzheimer's dementia     "50% loss"  . Cancer of kidney ([place])   . Acute pancreatitis hospitalized 07/2014    Past Surgical History  Procedure Laterality Date  . Thyroid surgery  1960's    "took out tumor"  . Joint replacement    . Total hip arthroplasty Bilateral 2004-2010  . Abdominal hysterectomy    . Nephrectomy  ~ 2011    "cancer; it was contained"  . Tonsillectomy    . Breast lumpectomy      "benign"  . Cataract extraction w/ intraocular lens implant      "? side"      Medication List       This list is accurate as of:  07/01/15  2:10 PM.  Always use your most recent med list.               acetaminophen 325 MG tablet  Commonly known as:  TYLENOL  Take 650 mg by mouth daily as needed for moderate pain (pain).     amoxicillin 500 MG capsule  Commonly known as:  AMOXIL  Take 1 capsule (500 mg total) by mouth 2 (two) times daily. X 7days     atorvastatin 40 MG tablet  Commonly known as:  LIPITOR  Take 40 mg by mouth at bedtime.     cholecalciferol 1000 UNITS tablet  Commonly known as:  VITAMIN D  Take 1,000 Units by mouth daily.     docusate sodium 100 MG capsule  Commonly known as:  COLACE  Take 100 mg by mouth 2 (two) times daily.     donepezil 10 MG tablet  Commonly known as:  ARICEPT  Take 10 mg by mouth at bedtime.     feeding supplement (ENSURE COMPLETE) Liqd  Take 237 mLs by mouth 2 (two) times daily between meals.     HYDROcodone-acetaminophen 5-325 MG tablet  Commonly known as:  NORCO/VICODIN  Take 1 tablet by mouth every 4 (four) hours as needed for severe pain.     ondansetron 4 MG tablet  Commonly known  as:  ZOFRAN  Take 4 mg by mouth every 6 (six) hours as needed for nausea.     polyethylene glycol packet  Commonly known as:  MIRALAX / GLYCOLAX  Take 17 g by mouth daily. Mix in 8 oz of liquid and drink     PROCTOCORT 1 % Crea  Generic drug:  hydrocortisone  Apply 1 application topically daily as needed (hemorrhoids).     Leoti Oint  Generic drug:  Petrolatum  Apply 1 application topically 3 (three) times daily. Apply to buttocks for skin protection     traMADol 50 MG tablet  Commonly known as:  ULTRAM  Take 1 tablet (50 mg total) by mouth 3 (three) times daily.     triamcinolone 0.025 % cream  Commonly known as:  KENALOG  Apply 1 application topically 2 (two) times daily. Apply to back and abdominal area twice daily for rash        No orders of the defined types were placed in this encounter.    Immunization History  Administered Date(s)  Administered  . Influenza-Unspecified 07/28/2014  . Pneumococcal-Unspecified 07/28/2014  . Tdap 06/07/2015    Social History  Substance Use Topics  . Smoking status: Never Smoker   . Smokeless tobacco: Never Used  . Alcohol Use: No    Filed Vitals:   07/01/15 1406  BP: 125/70  Pulse: 70  Temp: 97 F (36.1 C)  Resp: 19    Physical Exam  GENERAL APPEARANCE: Alert, No acute distress.  HEENT: Unremarkable. RESPIRATORY: Breathing is even, unlabored. Lung sounds are clear   CARDIOVASCULAR: Heart RRR no murmurs, rubs or gallops. No peripheral edema.  GASTROINTESTINAL: Abdomen is soft, non-tender, not distended w/ normal bowel sounds.  NEUROLOGIC: Cranial nerves 2-12 grossly intact. Moves all extremities  Patient Active Problem List   Diagnosis Date Noted  . Hyperlipidemia 06/13/2015  . Vitamin D deficiency 06/13/2015  . Hematuria 06/08/2015  . Fracture of right superior pubic ramus ([place]) 06/07/2015  . Fall 06/07/2015  . UTI (lower urinary tract infection) 12/04/2014  . Closed right hip fracture ([place]) 12/04/2014  . Alzheimer's dementia 12/04/2014    CBC    Component Value Date/Time   WBC 9.8 06/11/2015 0500   RBC 3.59* 06/11/2015 0500   HGB 11.4* 06/11/2015 0500   HCT 34.7* 06/11/2015 0500   PLT 348 06/11/2015 0500   MCV 96.7 06/11/2015 0500   LYMPHSABS 2.7 06/08/2015 0535   MONOABS 1.1* 06/08/2015 0535   EOSABS 0.0 06/08/2015 0535   BASOSABS 0.0 06/08/2015 0535    CMP     Component Value Date/Time   NA 139 06/11/2015 0500   K 4.2 06/11/2015 0500   CL 107 06/11/2015 0500   CO2 25 06/11/2015 0500   GLUCOSE 114* 06/11/2015 0500   BUN 35* 06/11/2015 0500   CREATININE 0.91 06/11/2015 0500   CALCIUM 8.8* 06/11/2015 0500   PROT 6.7 06/07/2015 2254   ALBUMIN 2.9* 06/07/2015 2254   AST 26 06/07/2015 2254   ALT 14 06/07/2015 2254   ALKPHOS 47 06/07/2015 2254   BILITOT 0.7 06/07/2015 2254   GFRNONAA 56* 06/11/2015 0500   GFRAA >60 06/11/2015 0500     Assessment and Plan  Pt is d/c in stable condition to ALF. Presrciptions have been written. Pt needs f/u appt with Urology as outpt for findings during hospitalization of hematuria and clot/mass in bladder.   Time spent > 30 min Hennie Duos, MD

## 2015-07-23 ENCOUNTER — Emergency Department (HOSPITAL_BASED_OUTPATIENT_CLINIC_OR_DEPARTMENT_OTHER): Payer: Medicare Other

## 2015-07-23 ENCOUNTER — Emergency Department (HOSPITAL_BASED_OUTPATIENT_CLINIC_OR_DEPARTMENT_OTHER)
Admission: EM | Admit: 2015-07-23 | Discharge: 2015-07-23 | Disposition: A | Discharge status: Home / Self Care | Payer: Medicare Other | Attending: Emergency Medicine | Admitting: Emergency Medicine

## 2015-07-23 DIAGNOSIS — Y998 Other external cause status: Secondary | ICD-10-CM | POA: Diagnosis not present

## 2015-07-23 DIAGNOSIS — W01198A Fall on same level from slipping, tripping and stumbling with subsequent striking against other object, initial encounter: Secondary | ICD-10-CM | POA: Insufficient documentation

## 2015-07-23 DIAGNOSIS — E785 Hyperlipidemia, unspecified: Secondary | ICD-10-CM | POA: Diagnosis not present

## 2015-07-23 DIAGNOSIS — Z96649 Presence of unspecified artificial hip joint: Secondary | ICD-10-CM | POA: Insufficient documentation

## 2015-07-23 DIAGNOSIS — W19XXXA Unspecified fall, initial encounter: Secondary | ICD-10-CM

## 2015-07-23 DIAGNOSIS — Y9389 Activity, other specified: Secondary | ICD-10-CM | POA: Insufficient documentation

## 2015-07-23 DIAGNOSIS — Z79899 Other long term (current) drug therapy: Secondary | ICD-10-CM | POA: Diagnosis not present

## 2015-07-23 DIAGNOSIS — Z7952 Long term (current) use of systemic steroids: Secondary | ICD-10-CM | POA: Insufficient documentation

## 2015-07-23 DIAGNOSIS — Z043 Encounter for examination and observation following other accident: Secondary | ICD-10-CM | POA: Diagnosis present

## 2015-07-23 DIAGNOSIS — Z8739 Personal history of other diseases of the musculoskeletal system and connective tissue: Secondary | ICD-10-CM | POA: Diagnosis not present

## 2015-07-23 DIAGNOSIS — F0280 Dementia in other diseases classified elsewhere without behavioral disturbance: Secondary | ICD-10-CM | POA: Insufficient documentation

## 2015-07-23 DIAGNOSIS — S79921A Unspecified injury of right thigh, initial encounter: Secondary | ICD-10-CM | POA: Diagnosis not present

## 2015-07-23 DIAGNOSIS — S79922A Unspecified injury of left thigh, initial encounter: Secondary | ICD-10-CM | POA: Diagnosis not present

## 2015-07-23 DIAGNOSIS — S3991XA Unspecified injury of abdomen, initial encounter: Secondary | ICD-10-CM | POA: Insufficient documentation

## 2015-07-23 DIAGNOSIS — G309 Alzheimer's disease, unspecified: Secondary | ICD-10-CM | POA: Insufficient documentation

## 2015-07-23 DIAGNOSIS — Y9289 Other specified places as the place of occurrence of the external cause: Secondary | ICD-10-CM | POA: Insufficient documentation

## 2015-07-23 DIAGNOSIS — Z85528 Personal history of other malignant neoplasm of kidney: Secondary | ICD-10-CM | POA: Diagnosis not present

## 2015-07-23 DIAGNOSIS — I10 Essential (primary) hypertension: Secondary | ICD-10-CM | POA: Diagnosis not present

## 2015-07-23 DIAGNOSIS — Z8719 Personal history of other diseases of the digestive system: Secondary | ICD-10-CM | POA: Insufficient documentation

## 2015-07-23 NOTE — ED Notes (Signed)
[facility] at bedside for transport.  

## 2015-07-23 NOTE — ED Notes (Signed)
Patient transported to X-ray 

## 2015-07-23 NOTE — ED Provider Notes (Signed)
MSE was initiated and I personally evaluated the patient and placed orders (if any) at  6:45 AM on July 23, 2015.  The patient appears stable so that the remainder of the MSE may be completed by another provider. Found down by staff. Noted by EMS to have hematoma to posterior scalp. No use of anticoagulants  Jola Schmidt, MD 07/23/15 [phone]

## 2015-07-23 NOTE — ED Notes (Signed)
Pt from [facility] found sitting on BR floor by staff EMS  Assessed lump on back of head golf ball size denies LOC or blurred vision Hx falls in the pass and treated for right hip fx

## 2015-07-23 NOTE — ED Provider Notes (Signed)
CSN: [id number]     Arrival date & time 07/23/15  5638 History   First MD Initiated Contact with Patient 07/23/15 0645     Chief Complaint  Patient presents with  . Fall     (Consider location/radiation/quality/duration/timing/severity/associated sxs/prior Treatment) HPI Level V caveat dementia. History is obtained from Holli Humbles, medical technician at [employer] via telephone. Patient was found on floor 5:30 AM today she had fallen a few minutes earlier. Patient reported that she had hit her head. She denies pain anywhere at present. No treatment prior to coming here. Past Medical History  Diagnosis Date  . Hypertension   . Hyperlipidemia   . Complication of anesthesia     "didn't wake up for a long time, pale, hypotension; got psychotic after one of her hip ORs in ~ 2010"  . Positive TB test     "father passed away from TB; she's negative/CXR"  . History of blood transfusion 2004    "related to hip replacement"  . Blood type, Rh negative   . Osteoarthritis     "severe in her hands" (12/04/2014)  . Alzheimer's dementia     "50% loss"  . Cancer of kidney ([place])   . Acute pancreatitis hospitalized 07/2014   Past Surgical History  Procedure Laterality Date  . Thyroid surgery  1960's    "took out tumor"  . Joint replacement    . Total hip arthroplasty Bilateral 2004-2010  . Abdominal hysterectomy    . Nephrectomy  ~ 2011    "cancer; it was contained"  . Tonsillectomy    . Breast lumpectomy      "benign"  . Cataract extraction w/ intraocular lens implant      "? side"   No family history on file. Social History  Substance Use Topics  . Smoking status: Never Smoker   . Smokeless tobacco: Never Used  . Alcohol Use: No   OB History    No data available     Review of Systems  Unable to perform ROS: Dementia  Musculoskeletal: Positive for gait problem.       Walks with walker also has wheelchair      Allergies  Oxybutynin and Sulfa antibiotics  Home  Medications   Prior to Admission medications   Medication Sig Start Date End Date Taking? Authorizing Provider  acetaminophen (TYLENOL) 325 MG tablet Take 650 mg by mouth daily as needed for moderate pain (pain).     Historical Provider, MD  atorvastatin (LIPITOR) 40 MG tablet Take 40 mg by mouth at bedtime.     Historical Provider, MD  cholecalciferol (VITAMIN D) 1000 UNITS tablet Take 1,000 Units by mouth daily.    Historical Provider, MD  docusate sodium (COLACE) 100 MG capsule Take 100 mg by mouth 2 (two) times daily.    Historical Provider, MD  donepezil (ARICEPT) 10 MG tablet Take 10 mg by mouth at bedtime.     Historical Provider, MD  feeding supplement, ENSURE COMPLETE, (ENSURE COMPLETE) LIQD Take 237 mLs by mouth 2 (two) times daily between meals. 12/07/14   Costin Karlyne Greenspan, MD  hydrocortisone (PROCTOCORT) 1 % CREA Apply 1 application topically daily as needed (hemorrhoids).    Historical Provider, MD  ondansetron (ZOFRAN) 4 MG tablet Take 4 mg by mouth every 6 (six) hours as needed for nausea.     Historical Provider, MD  polyethylene glycol (MIRALAX / GLYCOLAX) packet Take 17 g by mouth daily. Mix in 8 oz of liquid and drink  Historical Provider, MD  traMADol (ULTRAM) 50 MG tablet Take 1 tablet (50 mg total) by mouth 3 (three) times daily. 06/11/15   Ripudeep Krystal Eaton, MD  triamcinolone (KENALOG) 0.025 % cream Apply 1 application topically 2 (two) times daily. Apply to back and abdominal area twice daily for rash    Historical Provider, MD   BP 99/78 mmHg  Pulse 66  Temp(Src) 98.3 F (36.8 C) (Oral)  SpO2 100% Physical Exam  Constitutional: She appears well-developed and well-nourished. No distress.  HENT:  Head: Normocephalic and atraumatic.  Right Ear: External ear normal.  Left Ear: External ear normal.  No scalp hematoma. Bilateral tympanic membranes normal  Eyes: Conjunctivae are normal. Pupils are equal, round, and reactive to light.  Neck: Neck supple. No tracheal  deviation present. No thyromegaly present.  Cardiovascular: Normal rate and regular rhythm.   No murmur heard. Pulmonary/Chest: Effort normal and breath sounds normal.  Respiratory rate counted 20 by me  Abdominal: Soft. Bowel sounds are normal. She exhibits no distension. There is no tenderness.  Musculoskeletal: Normal range of motion. She exhibits no edema or tenderness.  Entire spine nontender. All 4 extremities or contusion abrasion or tenderness neurovascularly intact. Pelvis mildly tender on anterior to posterior compression. Also mild pain on internal rotation of bilateral thighs.  Neurological: She is alert. No cranial nerve deficit. Coordination normal.  Follow simple command moves all extremities  Skin: Skin is warm and dry. No rash noted.  Psychiatric: She has a normal mood and affect.  Pleasantly confused  Nursing note and vitals reviewed.   ED Course  Procedures (including critical care time) Labs Review Labs Reviewed - No data to display  Imaging Review No results found. I have personally reviewed and evaluated these images and lab results as part of my medical decision-making.   EKG Interpretation None     CT scan of pelvis was reviewed by Dr. Alvan Dame, fractures felt to be stable and old with callus formation. He feels that she can weight-bear as tolerated with supervision and follow-up with her primary care physician or orthopedist. Results for orders placed or performed during the hospital encounter of 06/07/15  Urine culture  Result Value Ref Range   Specimen Description URINE, CLEAN CATCH    Special Requests NONE    Culture      >=100,000 COLONIES/mL ENTEROCOCCUS SPECIES Performed at [facility]    Report Status 06/10/2015 FINAL    Organism ID, Bacteria ENTEROCOCCUS SPECIES       Susceptibility   Enterococcus species - MIC*    AMPICILLIN <=2 SENSITIVE Sensitive     LEVOFLOXACIN 1 SENSITIVE Sensitive     NITROFURANTOIN <=16 SENSITIVE Sensitive      VANCOMYCIN 2 SENSITIVE Sensitive     * >=100,000 COLONIES/mL ENTEROCOCCUS SPECIES  CBC with Differential  Result Value Ref Range   WBC 12.7 (H) 4.0 - 10.5 K/uL   RBC 3.74 (L) 3.87 - 5.11 MIL/uL   Hemoglobin 11.9 (L) 12.0 - 15.0 g/dL   HCT 35.7 (L) 36.0 - 46.0 %   MCV 95.5 78.0 - 100.0 fL   MCH 31.8 26.0 - 34.0 pg   MCHC 33.3 30.0 - 36.0 g/dL   RDW 13.5 11.5 - 15.5 %   Platelets 292 150 - 400 K/uL   Neutrophils Relative % 84 (H) 43 - 77 %   Neutro Abs 10.6 (H) 1.7 - 7.7 K/uL   Lymphocytes Relative 9 (L) 12 - 46 %   Lymphs Abs 1.2 0.7 -  4.0 K/uL   Monocytes Relative 7 3 - 12 %   Monocytes Absolute 0.9 0.1 - 1.0 K/uL   Eosinophils Relative 0 0 - 5 %   Eosinophils Absolute 0.0 0.0 - 0.7 K/uL   Basophils Relative 0 0 - 1 %   Basophils Absolute 0.0 0.0 - 0.1 K/uL  Basic metabolic panel  Result Value Ref Range   Sodium 139 135 - 145 mmol/L   Potassium 3.8 3.5 - 5.1 mmol/L   Chloride 105 101 - 111 mmol/L   CO2 24 22 - 32 mmol/L   Glucose, Bld 111 (H) 65 - 99 mg/dL   BUN 27 (H) 6 - 20 mg/dL   Creatinine, Ser 0.96 0.44 - 1.00 mg/dL   Calcium 9.2 8.9 - 10.3 mg/dL   GFR calc non Af Amer 52 (L) >60 mL/min   GFR calc Af Amer >60 >60 mL/min   Anion gap 10 5 - 15  Urinalysis, Routine w reflex microscopic (not at [facility])  Result Value Ref Range   Color, Urine AMBER (A) YELLOW   APPearance CLOUDY (A) CLEAR   Specific Gravity, Urine 1.015 1.005 - 1.030   pH 7.5 5.0 - 8.0   Glucose, UA NEGATIVE NEGATIVE mg/dL   Hgb urine dipstick LARGE (A) NEGATIVE   Bilirubin Urine NEGATIVE NEGATIVE   Ketones, ur NEGATIVE NEGATIVE mg/dL   Protein, ur 100 (A) NEGATIVE mg/dL   Urobilinogen, UA 1.0 0.0 - 1.0 mg/dL   Nitrite NEGATIVE NEGATIVE   Leukocytes, UA MODERATE (A) NEGATIVE  Urine microscopic-add on  Result Value Ref Range   Squamous Epithelial / LPF RARE RARE   WBC, UA 11-20 <3 WBC/hpf   RBC / HPF TOO NUMEROUS TO COUNT <3 RBC/hpf   Bacteria, UA MANY (A) RARE  CBC with Differential/Platelet   Result Value Ref Range   WBC 11.3 (H) 4.0 - 10.5 K/uL   RBC 3.63 (L) 3.87 - 5.11 MIL/uL   Hemoglobin 11.8 (L) 12.0 - 15.0 g/dL   HCT 34.5 (L) 36.0 - 46.0 %   MCV 95.0 78.0 - 100.0 fL   MCH 32.5 26.0 - 34.0 pg   MCHC 34.2 30.0 - 36.0 g/dL   RDW 14.1 11.5 - 15.5 %   Platelets 280 150 - 400 K/uL   Neutrophils Relative % 76 43 - 77 %   Neutro Abs 8.6 (H) 1.7 - 7.7 K/uL   Lymphocytes Relative 18 12 - 46 %   Lymphs Abs 2.1 0.7 - 4.0 K/uL   Monocytes Relative 6 3 - 12 %   Monocytes Absolute 0.7 0.1 - 1.0 K/uL   Eosinophils Relative 0 0 - 5 %   Eosinophils Absolute 0.0 0.0 - 0.7 K/uL   Basophils Relative 0 0 - 1 %   Basophils Absolute 0.0 0.0 - 0.1 K/uL  Protime-INR  Result Value Ref Range   Prothrombin Time 15.4 (H) 11.6 - 15.2 seconds   INR 1.20 0.00 - 1.49  Comprehensive metabolic panel  Result Value Ref Range   Sodium 138 135 - 145 mmol/L   Potassium 4.0 3.5 - 5.1 mmol/L   Chloride 106 101 - 111 mmol/L   CO2 22 22 - 32 mmol/L   Glucose, Bld 146 (H) 65 - 99 mg/dL   BUN 21 (H) 6 - 20 mg/dL   Creatinine, Ser 1.02 (H) 0.44 - 1.00 mg/dL   Calcium 8.8 (L) 8.9 - 10.3 mg/dL   Total Protein 6.7 6.5 - 8.1 g/dL   Albumin 2.9 (L) 3.5 -  5.0 g/dL   AST 26 15 - 41 U/L   ALT 14 14 - 54 U/L   Alkaline Phosphatase 47 38 - 126 U/L   Total Bilirubin 0.7 0.3 - 1.2 mg/dL   GFR calc non Af Amer 49 (L) >60 mL/min   GFR calc Af Amer 56 (L) >60 mL/min   Anion gap 10 5 - 15  CBC WITH DIFFERENTIAL  Result Value Ref Range   WBC 13.1 (H) 4.0 - 10.5 K/uL   RBC 3.78 (L) 3.87 - 5.11 MIL/uL   Hemoglobin 12.3 12.0 - 15.0 g/dL   HCT 36.5 36.0 - 46.0 %   MCV 96.6 78.0 - 100.0 fL   MCH 32.5 26.0 - 34.0 pg   MCHC 33.7 30.0 - 36.0 g/dL   RDW 14.1 11.5 - 15.5 %   Platelets 310 150 - 400 K/uL   Neutrophils Relative % 72 43 - 77 %   Neutro Abs 9.3 (H) 1.7 - 7.7 K/uL   Lymphocytes Relative 20 12 - 46 %   Lymphs Abs 2.7 0.7 - 4.0 K/uL   Monocytes Relative 8 3 - 12 %   Monocytes Absolute 1.1 (H) 0.1 - 1.0  K/uL   Eosinophils Relative 0 0 - 5 %   Eosinophils Absolute 0.0 0.0 - 0.7 K/uL   Basophils Relative 0 0 - 1 %   Basophils Absolute 0.0 0.0 - 0.1 K/uL  Basic metabolic panel  Result Value Ref Range   Sodium 139 135 - 145 mmol/L   Potassium 3.4 (L) 3.5 - 5.1 mmol/L   Chloride 106 101 - 111 mmol/L   CO2 22 22 - 32 mmol/L   Glucose, Bld 130 (H) 65 - 99 mg/dL   BUN 24 (H) 6 - 20 mg/dL   Creatinine, Ser 0.93 0.44 - 1.00 mg/dL   Calcium 8.8 (L) 8.9 - 10.3 mg/dL   GFR calc non Af Amer 54 (L) >60 mL/min   GFR calc Af Amer >60 >60 mL/min   Anion gap 11 5 - 15  CBC  Result Value Ref Range   WBC 10.5 4.0 - 10.5 K/uL   RBC 3.97 3.87 - 5.11 MIL/uL   Hemoglobin 12.9 12.0 - 15.0 g/dL   HCT 38.2 36.0 - 46.0 %   MCV 96.2 78.0 - 100.0 fL   MCH 32.5 26.0 - 34.0 pg   MCHC 33.8 30.0 - 36.0 g/dL   RDW 14.1 11.5 - 15.5 %   Platelets 301 150 - 400 K/uL  CBC  Result Value Ref Range   WBC 7.2 4.0 - 10.5 K/uL   RBC 3.88 3.87 - 5.11 MIL/uL   Hemoglobin 12.2 12.0 - 15.0 g/dL   HCT 37.9 36.0 - 46.0 %   MCV 97.7 78.0 - 100.0 fL   MCH 31.4 26.0 - 34.0 pg   MCHC 32.2 30.0 - 36.0 g/dL   RDW 14.5 11.5 - 15.5 %   Platelets 315 150 - 400 K/uL  Basic metabolic panel  Result Value Ref Range   Sodium 143 135 - 145 mmol/L   Potassium 4.3 3.5 - 5.1 mmol/L   Chloride 111 101 - 111 mmol/L   CO2 25 22 - 32 mmol/L   Glucose, Bld 105 (H) 65 - 99 mg/dL   BUN 34 (H) 6 - 20 mg/dL   Creatinine, Ser 1.03 (H) 0.44 - 1.00 mg/dL   Calcium 8.9 8.9 - 10.3 mg/dL   GFR calc non Af Amer 48 (L) >60 mL/min   GFR calc  Af Amer 56 (L) >60 mL/min   Anion gap 7 5 - 15  CBC  Result Value Ref Range   WBC 9.8 4.0 - 10.5 K/uL   RBC 3.59 (L) 3.87 - 5.11 MIL/uL   Hemoglobin 11.4 (L) 12.0 - 15.0 g/dL   HCT 34.7 (L) 36.0 - 46.0 %   MCV 96.7 78.0 - 100.0 fL   MCH 31.8 26.0 - 34.0 pg   MCHC 32.9 30.0 - 36.0 g/dL   RDW 14.1 11.5 - 15.5 %   Platelets 348 150 - 400 K/uL  Basic metabolic panel  Result Value Ref Range   Sodium 139  135 - 145 mmol/L   Potassium 4.2 3.5 - 5.1 mmol/L   Chloride 107 101 - 111 mmol/L   CO2 25 22 - 32 mmol/L   Glucose, Bld 114 (H) 65 - 99 mg/dL   BUN 35 (H) 6 - 20 mg/dL   Creatinine, Ser 0.91 0.44 - 1.00 mg/dL   Calcium 8.8 (L) 8.9 - 10.3 mg/dL   GFR calc non Af Amer 56 (L) >60 mL/min   GFR calc Af Amer >60 >60 mL/min   Anion gap 7 5 - 15   Ct Head Wo Contrast  07/23/2015  CLINICAL DATA:  Status post unwitnessed fall. Patient complains of pain in the back of her head. EXAM: CT HEAD WITHOUT CONTRAST CT CERVICAL SPINE WITHOUT CONTRAST TECHNIQUE: Multidetector CT imaging of the head and cervical spine was performed following the standard protocol without intravenous contrast. Multiplanar CT image reconstructions of the cervical spine were also generated. COMPARISON:  CT of the head and cervical spine dated 06/07/2015 FINDINGS: CT HEAD FINDINGS No mass effect or midline shift. No evidence of acute intracranial hemorrhage, or infarction. No abnormal extra-axial fluid collections. Gray-white matter differentiation is normal. Basal cisterns are preserved. There is stable brain parenchymal atrophy and chronic small vessel disease changes. No depressed skull fractures. Visualized paranasal sinuses and mastoid air cells are not opacified. CT CERVICAL SPINE FINDINGS There is straightening of the expected cervical lordosis, which may be positional. There is no evidence for acute fracture or dislocation. Again noted are multilevel osteoarthritic changes of the cervical spine with disc space narrowing, remodeling of the vertebral bodies and anterior and posterior osteophyte formation. Posterior facet arthropathy is also stable. There is a stable mild likely degenerative in etiology anterolisthesis of C4 on C3 and C5 on C4. Prevertebral soft tissues have a normal appearance. Lung apices have a normal appearance. IMPRESSION: No acute intracranial abnormality. Atrophy, chronic microvascular disease, stable. No evidence  of acute cervical spine fracture. Stable osteoarthritic changes of the cervical spine. Electronically Signed   By: Fidela Salisbury M.D.   On: 07/23/2015 08:27   Ct Cervical Spine Wo Contrast  07/23/2015  CLINICAL DATA:  Status post unwitnessed fall. Patient complains of pain in the back of her head. EXAM: CT HEAD WITHOUT CONTRAST CT CERVICAL SPINE WITHOUT CONTRAST TECHNIQUE: Multidetector CT imaging of the head and cervical spine was performed following the standard protocol without intravenous contrast. Multiplanar CT image reconstructions of the cervical spine were also generated. COMPARISON:  CT of the head and cervical spine dated 06/07/2015 FINDINGS: CT HEAD FINDINGS No mass effect or midline shift. No evidence of acute intracranial hemorrhage, or infarction. No abnormal extra-axial fluid collections. Gray-white matter differentiation is normal. Basal cisterns are preserved. There is stable brain parenchymal atrophy and chronic small vessel disease changes. No depressed skull fractures. Visualized paranasal sinuses and mastoid air cells are not opacified.  CT CERVICAL SPINE FINDINGS There is straightening of the expected cervical lordosis, which may be positional. There is no evidence for acute fracture or dislocation. Again noted are multilevel osteoarthritic changes of the cervical spine with disc space narrowing, remodeling of the vertebral bodies and anterior and posterior osteophyte formation. Posterior facet arthropathy is also stable. There is a stable mild likely degenerative in etiology anterolisthesis of C4 on C3 and C5 on C4. Prevertebral soft tissues have a normal appearance. Lung apices have a normal appearance. IMPRESSION: No acute intracranial abnormality. Atrophy, chronic microvascular disease, stable. No evidence of acute cervical spine fracture. Stable osteoarthritic changes of the cervical spine. Electronically Signed   By: Fidela Salisbury M.D.   On: 07/23/2015 08:27   Ct Pelvis  Wo Contrast  07/23/2015  ADDENDUM REPORT: 07/23/2015 09:14 ADDENDUM: Comparison is made with right hip CT of June 08, 2015, and CT of abdomen and pelvis of July 22, 2014. The fractures involving the right superior and inferior pubic rami were present on the prior exam of September 2016, but not October 2015. The fractures involving the right sacrum and right posterior iliac wing were not present on the prior study of 2015, and it cannot be ascertained if they were present on the September 2016 study as they were not included in the field-of-view. These findings were discussed with Dr. Winfred Leeds. Electronically Signed   By: Marijo Conception, M.D.   On: 07/23/2015 09:14  07/23/2015  CLINICAL DATA:  Pain after fall. EXAM: CT PELVIS WITHOUT CONTRAST TECHNIQUE: Multidetector CT imaging of the pelvis was performed following the standard protocol without intravenous contrast. COMPARISON:  None. FINDINGS: Status post bilateral total hip arthroplasties. Comminuted fracture is seen involving the medial portions of the right superior and inferior pubic rami and pubic symphysis. Nondisplaced fracture is seen involving the posterior portion of the right iliac bone lateral to sacroiliac joint. Imaging is somewhat limited due to scatter artifact arising from the hip prostheses. Also noted is minimally displaced right sacral fracture. Atherosclerosis of visualized portion of abdominal aorta and iliac arteries is noted. There is no evidence of bowel obstruction. Sigmoid diverticulosis is noted without inflammation. No abnormal fluid collection is noted. IMPRESSION: Images are somewhat limited due to scatter artifact arising from bilateral total hip arthroplasties. Comminuted fractures are seen involving the medial portions of the right superior and inferior pubic rami, as well as the right side of the pubic symphysis. Also noted are nondisplaced fractures involving the posterior portion of the right iliac bone and right  sacrum adjacent to the right sacroiliac joint. Electronically Signed: By: Marijo Conception, M.D. On: 07/23/2015 08:28    MDM  CT of brain and cervical spine ordered as cannot clear patient clinically. Patient reportedly fell and hit her head. CT scan of pelvis ordered due to tenderness Final diagnoses:  None   diagnosis fall      Orlie Dakin, MD 07/23/15 1010

## 2015-07-23 NOTE — Discharge Instructions (Signed)
Fall Prevention in Hospitals, Adult Kaitlin Rodriguez has fractures of her pelvis which are felt to be old. No further treatment necessary. She can weight-bear as tolerated under supervision and with a walker. She can follow-up with her primary care physician if she has significant pain or difficulty walking As a hospital patient, your condition and the treatments you receive can increase your risk for falls. Some additional risk factors for falls in a hospital include:  Being in an unfamiliar environment.  Being on bed rest.  Your surgery.  Taking certain medicines.  Your tubing requirements, such as intravenous (IV) therapy or catheters. It is important that you learn how to decrease fall risks while at the hospital. Below are important tips that can help prevent falls. SAFETY TIPS FOR PREVENTING FALLS Talk about your risk of falling.  Ask your health care provider why you are at risk for falling. Is it your medicine, illness, tubing placement, or something else?  Make a plan with your health care provider to keep you safe from falls.  Ask your health care provider or pharmacist about side effects of your medicines. Some medicines can make you dizzy or affect your coordination. Ask for help.  Ask for help before getting out of bed. You may need to press your call button.  Ask for assistance in getting safely to the toilet.  Ask for a walker or cane to be put at your bedside. Ask that most of the side rails on your bed be placed up before your health care provider leaves the room.  Ask family or friends to sit with you.  Ask for things that are out of your reach, such as your glasses, hearing aids, telephone, bedside table, or call button. Follow these tips to avoid falling:  Stay lying or seated, rather than standing, while waiting for help.  Wear rubber-soled slippers or shoes whenever you walk in the hospital.  Avoid quick, sudden movements.  Change positions slowly.  Sit on  the side of your bed before standing.  Stand up slowly and wait before you start to walk.  Let your health care provider know if there is a spill on the floor.  Pay careful attention to the medical equipment, electrical cords, and tubes around you.  When you need help, use your call button by your bed or in the bathroom. Wait for one of your health care providers to help you.  If you feel dizzy or unsure of your footing, return to bed and wait for assistance.  Avoid being distracted by the TV, telephone, or another person in your room.  Do not lean or support yourself on rolling objects, such as IV poles or bedside tables.   This information is not intended to replace advice given to you by your health care provider. Make sure you discuss any questions you have with your health care provider.   Document Released: 09/10/2000 Document Revised: 10/04/2014 Document Reviewed: 05/21/2012 Elsevier Interactive Patient Education [employer].

## 2015-10-09 ENCOUNTER — Encounter (HOSPITAL_COMMUNITY): Payer: Self-pay | Admitting: *Deleted

## 2015-10-09 ENCOUNTER — Emergency Department (HOSPITAL_COMMUNITY)
Admission: EM | Admit: 2015-10-09 | Discharge: 2015-10-10 | Disposition: A | Discharge status: Home / Self Care | Payer: Medicare Other | Attending: Physician Assistant | Admitting: Physician Assistant

## 2015-10-09 DIAGNOSIS — Z7952 Long term (current) use of systemic steroids: Secondary | ICD-10-CM | POA: Diagnosis not present

## 2015-10-09 DIAGNOSIS — Z8739 Personal history of other diseases of the musculoskeletal system and connective tissue: Secondary | ICD-10-CM | POA: Diagnosis not present

## 2015-10-09 DIAGNOSIS — S72002S Fracture of unspecified part of neck of left femur, sequela: Secondary | ICD-10-CM | POA: Diagnosis not present

## 2015-10-09 DIAGNOSIS — G309 Alzheimer's disease, unspecified: Secondary | ICD-10-CM | POA: Diagnosis not present

## 2015-10-09 DIAGNOSIS — Z8719 Personal history of other diseases of the digestive system: Secondary | ICD-10-CM | POA: Diagnosis not present

## 2015-10-09 DIAGNOSIS — E785 Hyperlipidemia, unspecified: Secondary | ICD-10-CM | POA: Diagnosis not present

## 2015-10-09 DIAGNOSIS — Z79899 Other long term (current) drug therapy: Secondary | ICD-10-CM | POA: Insufficient documentation

## 2015-10-09 DIAGNOSIS — I10 Essential (primary) hypertension: Secondary | ICD-10-CM | POA: Diagnosis not present

## 2015-10-09 DIAGNOSIS — W1839XS Other fall on same level, sequela: Secondary | ICD-10-CM | POA: Insufficient documentation

## 2015-10-09 DIAGNOSIS — Z79891 Long term (current) use of opiate analgesic: Secondary | ICD-10-CM | POA: Insufficient documentation

## 2015-10-09 DIAGNOSIS — F0280 Dementia in other diseases classified elsewhere without behavioral disturbance: Secondary | ICD-10-CM | POA: Diagnosis not present

## 2015-10-09 DIAGNOSIS — M25552 Pain in left hip: Secondary | ICD-10-CM | POA: Diagnosis present

## 2015-10-09 DIAGNOSIS — Z8611 Personal history of tuberculosis: Secondary | ICD-10-CM | POA: Insufficient documentation

## 2015-10-09 DIAGNOSIS — Z85528 Personal history of other malignant neoplasm of kidney: Secondary | ICD-10-CM | POA: Insufficient documentation

## 2015-10-09 LAB — CBC WITH DIFFERENTIAL/PLATELET
BASOS PCT: 0 %
Basophils Absolute: 0 10*3/uL (ref 0.0–0.1)
EOS ABS: 0.1 10*3/uL (ref 0.0–0.7)
Eosinophils Relative: 1 %
HCT: 35.8 % — ABNORMAL LOW (ref 36.0–46.0)
HEMOGLOBIN: 12.2 g/dL (ref 12.0–15.0)
Lymphocytes Relative: 28 %
Lymphs Abs: 2.9 10*3/uL (ref 0.7–4.0)
MCH: 33.3 pg (ref 26.0–34.0)
MCHC: 34.1 g/dL (ref 30.0–36.0)
MCV: 97.8 fL (ref 78.0–100.0)
Monocytes Absolute: 1 10*3/uL (ref 0.1–1.0)
Monocytes Relative: 10 %
NEUTROS PCT: 61 %
Neutro Abs: 6.2 10*3/uL (ref 1.7–7.7)
PLATELETS: 257 10*3/uL (ref 150–400)
RBC: 3.66 MIL/uL — AB (ref 3.87–5.11)
RDW: 15.4 % (ref 11.5–15.5)
WBC: 10.3 10*3/uL (ref 4.0–10.5)

## 2015-10-09 LAB — BASIC METABOLIC PANEL
Anion gap: 11 (ref 5–15)
BUN: 24 mg/dL — ABNORMAL HIGH (ref 6–20)
CHLORIDE: 111 mmol/L (ref 101–111)
CO2: 20 mmol/L — ABNORMAL LOW (ref 22–32)
CREATININE: 1.16 mg/dL — AB (ref 0.44–1.00)
Calcium: 9 mg/dL (ref 8.9–10.3)
GFR calc non Af Amer: 42 mL/min — ABNORMAL LOW (ref >60)
GFR, EST AFRICAN AMERICAN: 48 mL/min — AB (ref >60)
Glucose, Bld: 95 mg/dL (ref 65–99)
POTASSIUM: 4.6 mmol/L (ref 3.5–5.1)
SODIUM: 142 mmol/L (ref 135–145)

## 2015-10-09 MED ORDER — FENTANYL CITRATE (PF) 100 MCG/2ML IJ SOLN: 25.0000 ug | Freq: Once | INTRAMUSCULAR | Status: DC

## 2015-10-09 NOTE — ED Notes (Signed)
[facility] called  

## 2015-10-09 NOTE — Discharge Instructions (Signed)
Continue taking her medications as prescribed. I spoke with case management regarding setting up home health services for physical therapy and occupational therapy regarding your left hip fracture and orders have been place to get therapy started at your nursing facility. Return to the emergency department if symptoms worsen or new onset of fever, change in mental status, numbness, tingling, weakness.

## 2015-10-09 NOTE — ED Notes (Signed)
[place] PA will update pt daugther

## 2015-10-09 NOTE — Care Management Note (Signed)
Case Management Note  Patient Details  Name: Kaitlin Rodriguez MRN: [id number] Date of Birth: 1928/08/04  Subjective/Objective:      Patient presents to [facility] by EMS s/p mechanical fall            Action/Plan: CM received consult concerning placement, CM discussed  with CSW patient does not have a 3 day qualifying. Patient is a resident at [facility] in [place]. CM discussed this with  EDP, [place] services  is a recommendation, EDP spoke with daughter regarding plan and daughter is agreeable. Facility has a Soil scientist with [facility] for [facility] services. CM will fax referral to [facility] [phone]. No further CM needs identified.  Expected Discharge Date:   10/09/15               Expected Discharge Plan:  [place]  In-House Referral:  Clinical Social Work  Discharge planning Services  CM Consult  Post Acute Care Choice:    Choice offered to:  Patient  DME Arranged:  3-N-1 DME Agency:  [place]:  RN, PT, OT [facility] Agency:  [place]  Status of Service:  Completed, signed off  Medicare Important Message Given:    Date Medicare IM Given:    Medicare IM give by:    Date Additional Medicare IM Given:    Additional Medicare Important Message give by:     If discussed at [place] of Stay Meetings, dates discussed:    Additional CommentsLaurena Slimmer, RN 10/09/2015, 10:58 PM

## 2015-10-09 NOTE — ED Notes (Signed)
Pt fell last night at her assisted living facility [facility] [employer]) Pt was taken to [facility] for a evaluation anf her head was stabled were she fell.  Pt was discharged and sent home.  This morning EMS was called to take her back to the ER for hip pain.  Upon XRay, there was a fracture noted.  Pt was given a script for pain meds and sent back home.  Shortly after getting back to the assisted living facility EMS was called out again b/c her daughter in [place] wants her to go to a rehab facility, they want [place] to help get that process started.

## 2015-10-09 NOTE — ED Notes (Signed)
Pt daughter calling for update, phone given to [place] PA.

## 2015-10-09 NOTE — Progress Notes (Signed)
Spoke with MD, pt has traditional Medicare which requires a 3 night qualifying IP stay before SNF rehab would be covered.  Pt does not meet criteria for admission, per MD.  [facility] c/s for the possibility of arranging [place].

## 2015-10-09 NOTE — ED Provider Notes (Signed)
CSN: [id number]     Arrival date & time 10/09/15  1641 History   First MD Initiated Contact with Patient 10/09/15 1656     Chief Complaint  Patient presents with  . Fall     (Consider location/radiation/quality/duration/timing/severity/associated sxs/prior Treatment) HPI   Patient is a 82 year old female with past medical history of hypertension, osteoarthritis, Alzheimer's dementia who presents to the ED via EMS for placement. Patient is a resident from [facility]	[place]. EMS reports patient fell last night at her facility, was taken to [facility] ED for evaluation, negative labs and head CT and patient was discharged back to facility. This morning EMS was then called again to seek patient to the ER for hip pain, x-ray revealed left hip fracture and patient was discharged back to the facility with a prescription for pain meds. After patient returned to facility EMS was called a third time due to patient's daughter in [place] wanting her to go to a rehabilitation facility for further care of her left hip fracture. Patient reports mild pain to her left hip but notes she does not have any pain while laying in bed. Denies numbness, tingling, weakness. Patient denies any other complaints at this time.  Past Medical History  Diagnosis Date  . Hypertension   . Hyperlipidemia   . Complication of anesthesia     "didn't wake up for a long time, pale, hypotension; got psychotic after one of her hip ORs in ~ 2010"  . Positive TB test     "father passed away from TB; she's negative/CXR"  . History of blood transfusion 2004    "related to hip replacement"  . Blood type, Rh negative   . Osteoarthritis     "severe in her hands" (12/04/2014)  . Alzheimer's dementia     "50% loss"  . Cancer of kidney ([place])   . Acute pancreatitis hospitalized 07/2014   Past Surgical History  Procedure Laterality Date  . Thyroid surgery  1960's    "took out tumor"  . Joint replacement    . Total hip  arthroplasty Bilateral 2004-2010  . Abdominal hysterectomy    . Nephrectomy  ~ 2011    "cancer; it was contained"  . Tonsillectomy    . Breast lumpectomy      "benign"  . Cataract extraction w/ intraocular lens implant      "? side"   No family history on file. Social History  Substance Use Topics  . Smoking status: Never Smoker   . Smokeless tobacco: Never Used  . Alcohol Use: No   OB History    No data available     Review of Systems  Musculoskeletal: Positive for arthralgias (left hip).  All other systems reviewed and are negative.     Allergies  Oxybutynin and Sulfa antibiotics  Home Medications   Prior to Admission medications   Medication Sig Start Date End Date Taking? Authorizing Provider  acetaminophen (TYLENOL) 325 MG tablet Take 650 mg by mouth daily as needed for moderate pain (pain).    Yes Historical Provider, MD  atorvastatin (LIPITOR) 40 MG tablet Take 40 mg by mouth at bedtime.    Yes Historical Provider, MD  cholecalciferol (VITAMIN D) 1000 UNITS tablet Take 1,000 Units by mouth daily.   Yes Historical Provider, MD  docusate sodium (COLACE) 100 MG capsule Take 100 mg by mouth 2 (two) times daily.   Yes Historical Provider, MD  donepezil (ARICEPT) 10 MG tablet Take 10 mg by mouth at  bedtime.    Yes Historical Provider, MD  feeding supplement, ENSURE COMPLETE, (ENSURE COMPLETE) LIQD Take 237 mLs by mouth 2 (two) times daily between meals. 12/07/14  Yes Costin Karlyne Greenspan, MD  gabapentin (NEURONTIN) 300 MG capsule Take 300 mg by mouth 2 (two) times daily as needed (pain).    Yes Historical Provider, MD  hydrocortisone (PROCTOCORT) 1 % CREA Apply 1 application topically daily as needed (hemorrhoids).   Yes Historical Provider, MD  loperamide (IMODIUM) 2 MG capsule Take 2 mg by mouth as needed for diarrhea or loose stools.   Yes Historical Provider, MD  ondansetron (ZOFRAN) 4 MG tablet Take 4 mg by mouth every 6 (six) hours as needed for nausea.    Yes Historical  Provider, MD  polyethylene glycol (MIRALAX / GLYCOLAX) packet Take 17 g by mouth daily. Mix in 8 oz of liquid and drink   Yes Historical Provider, MD  traMADol (ULTRAM) 50 MG tablet Take 1 tablet (50 mg total) by mouth 3 (three) times daily. 06/11/15  Yes Ripudeep Krystal Eaton, MD  triamcinolone (KENALOG) 0.025 % cream Apply 1 application topically 2 (two) times daily. Apply to back and abdominal area twice daily for rash   Yes Historical Provider, MD   BP 99/59 mmHg  Pulse 67  Temp(Src) 99.8 F (37.7 C) (Oral)  Resp 18  SpO2 99% Physical Exam  Constitutional: She appears well-developed and well-nourished.  HENT:  Head: Normocephalic and atraumatic.  Eyes: Conjunctivae and EOM are normal. Right eye exhibits no discharge. Left eye exhibits no discharge. No scleral icterus.  Neck: Normal range of motion. Neck supple.  Cardiovascular: Normal rate, regular rhythm, normal heart sounds and intact distal pulses.   Pulmonary/Chest: Effort normal and breath sounds normal.  Abdominal: Soft. Bowel sounds are normal. There is no tenderness.  Musculoskeletal: She exhibits no edema.  Left hip TTP, no signs of swelling, lacerations, contusions or abrasions. Decreased ROM of left lower extremity at hip and knee due to pain. Sensation intact. 2+ DP pulses. Cap refill <2.   Neurological:  Alert and oriented to person and place.   Skin: Skin is warm and dry.  Healing laceration to superior scalp with staples in place, no drainage or surrounding erythema/swelling.  Nursing note and vitals reviewed.   ED Course  Procedures (including critical care time) Labs Review Labs Reviewed  CBC WITH DIFFERENTIAL/PLATELET - Abnormal; Notable for the following:    RBC 3.66 (*)    HCT 35.8 (*)    All other components within normal limits  BASIC METABOLIC PANEL - Abnormal; Notable for the following:    CO2 20 (*)    BUN 24 (*)    Creatinine, Ser 1.16 (*)    GFR calc non Af Amer 42 (*)    GFR calc Af Amer 48 (*)     All other components within normal limits    Imaging Review No results found. I have personally reviewed and evaluated these images and lab results as part of my medical decision-making.  Filed Vitals:   10/09/15 2000 10/10/15 0003  BP: 128/75 99/59  Pulse: 76 67  Temp:    Resp: 16 18     MDM   Final diagnoses:  Closed left hip fracture, sequela    Patient presents for placement. Patient fell last night (not on any anticoagulants) at assisted living facility who is evaluated at [facility] regional ED, negative head CT and labs, staples place on head laceration patient was discharged back to the  facility. Patient then started complaining of left hip pain facility and was sent back to [facility] ED, x-ray revealed left hip fracture, patient was discharged home with pain meds. Patient's daughter requested patient be sent [facility] ED for placement regarding rehabilitation. Patient reports having left hip pain with movement. VSS. Exam revealed TTP of left hip and decreased range of motion due to pain, left lower extremity neurovascularly intact. Healing head laceration with staples in place, no evidence of bleeding or infection. Due to patient not having any new complaints at this time and reviewing records from recent ED visits at [facility] regional I do not feel any further workup or imaging is warranted at this time. I discussed patient's case with social worker and case management regarding rehabilitation. Social worker advised patient does not qualify to be transferred to rehabilitation facility. Case management recommended to set up home health physical/occupational therapy at patient's current living facility. Orders place for PT/OT. I spoke with patient's daughter regarding plan for discharge and orders place for home health.  Evaluation does not show pathology requring ongoing emergent intervention or admission. Pt is hemodynamically stable and mentating appropriately.  Discussed findings/results and plan with patient/guardian, who agrees with plan. All questions answered. Return precautions discussed and outpatient follow up given.      Chesley Noon [place], [place] 10/10/15 [place], MD 10/14/15 [phone]

## 2015-10-11 ENCOUNTER — Encounter (HOSPITAL_BASED_OUTPATIENT_CLINIC_OR_DEPARTMENT_OTHER): Payer: Self-pay | Admitting: *Deleted

## 2015-10-11 ENCOUNTER — Emergency Department (HOSPITAL_BASED_OUTPATIENT_CLINIC_OR_DEPARTMENT_OTHER): Payer: Medicare Other

## 2015-10-11 ENCOUNTER — Emergency Department (HOSPITAL_BASED_OUTPATIENT_CLINIC_OR_DEPARTMENT_OTHER)
Admission: EM | Admit: 2015-10-11 | Discharge: 2015-10-11 | Disposition: A | Discharge status: Home / Self Care | Payer: Medicare Other | Attending: Emergency Medicine | Admitting: Emergency Medicine

## 2015-10-11 DIAGNOSIS — S0990XA Unspecified injury of head, initial encounter: Secondary | ICD-10-CM | POA: Diagnosis not present

## 2015-10-11 DIAGNOSIS — S161XXA Strain of muscle, fascia and tendon at neck level, initial encounter: Secondary | ICD-10-CM

## 2015-10-11 DIAGNOSIS — W19XXXA Unspecified fall, initial encounter: Secondary | ICD-10-CM

## 2015-10-11 DIAGNOSIS — Z85528 Personal history of other malignant neoplasm of kidney: Secondary | ICD-10-CM | POA: Insufficient documentation

## 2015-10-11 DIAGNOSIS — S52035A Nondisplaced fracture of olecranon process with intraarticular extension of left ulna, initial encounter for closed fracture: Secondary | ICD-10-CM | POA: Diagnosis not present

## 2015-10-11 DIAGNOSIS — S79912A Unspecified injury of left hip, initial encounter: Secondary | ICD-10-CM | POA: Diagnosis not present

## 2015-10-11 DIAGNOSIS — I10 Essential (primary) hypertension: Secondary | ICD-10-CM | POA: Diagnosis not present

## 2015-10-11 DIAGNOSIS — W01198A Fall on same level from slipping, tripping and stumbling with subsequent striking against other object, initial encounter: Secondary | ICD-10-CM | POA: Insufficient documentation

## 2015-10-11 DIAGNOSIS — S3289XA Fracture of other parts of pelvis, initial encounter for closed fracture: Secondary | ICD-10-CM | POA: Diagnosis not present

## 2015-10-11 DIAGNOSIS — Y9289 Other specified places as the place of occurrence of the external cause: Secondary | ICD-10-CM | POA: Diagnosis not present

## 2015-10-11 DIAGNOSIS — F0280 Dementia in other diseases classified elsewhere without behavioral disturbance: Secondary | ICD-10-CM | POA: Insufficient documentation

## 2015-10-11 DIAGNOSIS — E785 Hyperlipidemia, unspecified: Secondary | ICD-10-CM | POA: Insufficient documentation

## 2015-10-11 DIAGNOSIS — Z8719 Personal history of other diseases of the digestive system: Secondary | ICD-10-CM | POA: Insufficient documentation

## 2015-10-11 DIAGNOSIS — Y998 Other external cause status: Secondary | ICD-10-CM | POA: Insufficient documentation

## 2015-10-11 DIAGNOSIS — F309 Manic episode, unspecified: Secondary | ICD-10-CM | POA: Insufficient documentation

## 2015-10-11 DIAGNOSIS — S42402A Unspecified fracture of lower end of left humerus, initial encounter for closed fracture: Secondary | ICD-10-CM

## 2015-10-11 DIAGNOSIS — Z79899 Other long term (current) drug therapy: Secondary | ICD-10-CM | POA: Insufficient documentation

## 2015-10-11 DIAGNOSIS — S59902A Unspecified injury of left elbow, initial encounter: Secondary | ICD-10-CM | POA: Diagnosis present

## 2015-10-11 DIAGNOSIS — M1990 Unspecified osteoarthritis, unspecified site: Secondary | ICD-10-CM | POA: Insufficient documentation

## 2015-10-11 DIAGNOSIS — S329XXA Fracture of unspecified parts of lumbosacral spine and pelvis, initial encounter for closed fracture: Secondary | ICD-10-CM

## 2015-10-11 DIAGNOSIS — Y9389 Activity, other specified: Secondary | ICD-10-CM | POA: Insufficient documentation

## 2015-10-11 MED ORDER — HYDROCODONE-ACETAMINOPHEN 5-325 MG PO TABS
1.0000 | ORAL_TABLET | Freq: Four times a day (QID) | ORAL | Status: DC | PRN
  Administered 2015-10-11: 1 via ORAL
  Filled 2015-10-11: qty 1

## 2015-10-11 MED ORDER — HYDROCODONE-ACETAMINOPHEN 5-325 MG PO TABS: 1.0000 | ORAL_TABLET | Freq: Four times a day (QID) | ORAL | Status: AC | PRN

## 2015-10-11 NOTE — Discharge Instructions (Signed)
Hydrocodone as prescribed as needed for pain.  Ice your elbow for 20 minutes every 2 hours while awake for the next 2 days.  Wear elbow splint as applied until followed up by orthopedics.  You are to follow-up with Kaitlin Rodriguez, the hand surgeon. The contact information for his office has been provided in this discharge summary. Please call to arrange an appointment in the next 2-3 days.  Continue weightbearing as tolerated.   Fall Prevention in Hospitals, Adult As a hospital patient, your condition and the treatments you receive can increase your risk for falls. Some additional risk factors for falls in a hospital include:  Being in an unfamiliar environment.  Being on bed rest.  Your surgery.  Taking certain medicines.  Your tubing requirements, such as intravenous (IV) therapy or catheters. It is important that you learn how to decrease fall risks while at the hospital. Below are important tips that can help prevent falls. SAFETY TIPS FOR PREVENTING FALLS Talk about your risk of falling.  Ask your health care provider why you are at risk for falling. Is it your medicine, illness, tubing placement, or something else?  Make a plan with your health care provider to keep you safe from falls.  Ask your health care provider or pharmacist about side effects of your medicines. Some medicines can make you dizzy or affect your coordination. Ask for help.  Ask for help before getting out of bed. You may need to press your call button.  Ask for assistance in getting safely to the toilet.  Ask for a walker or cane to be put at your bedside. Ask that most of the side rails on your bed be placed up before your health care provider leaves the room.  Ask family or friends to sit with you.  Ask for things that are out of your reach, such as your glasses, hearing aids, telephone, bedside table, or call button. Follow these tips to avoid falling:  Stay lying or seated, rather than standing,  while waiting for help.  Wear rubber-soled slippers or shoes whenever you walk in the hospital.  Avoid quick, sudden movements.  Change positions slowly.  Sit on the side of your bed before standing.  Stand up slowly and wait before you start to walk.  Let your health care provider know if there is a spill on the floor.  Pay careful attention to the medical equipment, electrical cords, and tubes around you.  When you need help, use your call button by your bed or in the bathroom. Wait for one of your health care providers to help you.  If you feel dizzy or unsure of your footing, return to bed and wait for assistance.  Avoid being distracted by the TV, telephone, or another person in your room.  Do not lean or support yourself on rolling objects, such as IV poles or bedside tables.   This information is not intended to replace advice given to you by your health care provider. Make sure you discuss any questions you have with your health care provider.   Document Released: 09/10/2000 Document Revised: 10/04/2014 Document Reviewed: 05/21/2012 Elsevier Interactive Patient Education 2016 [place].  Elbow Fracture, Simple A fracture is a break in one of the bones.When fractures are not displaced or separated, they may be treated with only a sling or splint. The sling or splint may only be required for two to three weeks. In these cases, often the elbow is put through early range of motion  exercises to prevent the elbow from getting stiff. DIAGNOSIS  The diagnosis (learning what is wrong) of a fractured elbow is made by x-ray. These may be required before and after the elbow is put into a splint or cast. X-rays are taken after to make sure the bone pieces have not moved. HOME CARE INSTRUCTIONS   Only take over-the-counter or prescription medicines for pain, discomfort, or fever as directed by your caregiver.  If you have a splint held on with an elastic wrap and your hand or  fingers become numb or cold and blue, loosen the wrap and reapply more loosely. See your caregiver if there is no relief.  You may use ice for twenty minutes, four times per day, for the first two to three days.  Use your elbow as directed.  See your caregiver as directed. It is very important to keep all follow-up referrals and appointments in order to avoid any long-term problems with your elbow including chronic pain or stiffness. SEEK IMMEDIATE MEDICAL CARE IF:   There is swelling or increasing pain in elbow.  You begin to lose feeling or experience numbness or tingling in your hand or fingers.  You develop swelling of the hand and fingers.  You get a cold or blue hand or fingers on affected side. MAKE SURE YOU:   Understand these instructions.  Will watch your condition.  Will get help right away if you are not doing well or get worse.   This information is not intended to replace advice given to you by your health care provider. Make sure you discuss any questions you have with your health care provider.   Document Released: 09/07/2001 Document Revised: 12/06/2011 Document Reviewed: 07/29/2009 Elsevier Interactive Patient Education [employer].

## 2015-10-11 NOTE — ED Notes (Signed)
From nursing home,  Fall  C/o left elbow pain, bruising and pain to left outer thigh, pain to back of head  Witness fall  No loc

## 2015-10-11 NOTE — ED Notes (Addendum)
Fell at [facility].  Witnessed fall.  Unknown LOC.  Reports hx of same.  Pelvic fracture and staples to frontal head on Wednesday.  Pt A/O per her norm.

## 2015-10-11 NOTE — ED Notes (Addendum)
Left arm posterior pre-paded fiberglass splint applied after arm padded with web-roll cast padding applied . Arm sling also applied.

## 2015-10-11 NOTE — ED Notes (Signed)
[facility] called to transport pt 

## 2015-10-11 NOTE — ED Provider Notes (Signed)
CSN: [id number]     Arrival date & time 10/11/15  0216 History   First MD Initiated Contact with Patient 10/11/15 0221     Chief Complaint  Patient presents with  . Fall     (Consider location/radiation/quality/duration/timing/severity/associated sxs/prior Treatment) HPI Comments: Patient is an 82 year old female with history of dementia. She is brought by EMS from her extended care facility for evaluation of a fall. She is attempting to ambulate when she fell backwards and hit the back of her head. There is no reported loss of consciousness. He complains of pain in her left hip and left elbow. History is somewhat limited from the patient due to her dementia.  Patient is a 82 y.o. female presenting with fall. The history is provided by the patient, the nursing home and the EMS personnel.  Fall This is a new problem. The current episode started less than 1 hour ago. The problem occurs constantly. The problem has not changed since onset.Nothing aggravates the symptoms. Nothing relieves the symptoms.    Past Medical History  Diagnosis Date  . Hypertension   . Hyperlipidemia   . Complication of anesthesia     "didn't wake up for a long time, pale, hypotension; got psychotic after one of her hip ORs in ~ 2010"  . Positive TB test     "father passed away from TB; she's negative/CXR"  . History of blood transfusion 2004    "related to hip replacement"  . Blood type, Rh negative   . Osteoarthritis     "severe in her hands" (12/04/2014)  . Alzheimer's dementia     "50% loss"  . Cancer of kidney ([place])   . Acute pancreatitis hospitalized 07/2014   Past Surgical History  Procedure Laterality Date  . Thyroid surgery  1960's    "took out tumor"  . Joint replacement    . Total hip arthroplasty Bilateral 2004-2010  . Abdominal hysterectomy    . Nephrectomy  ~ 2011    "cancer; it was contained"  . Tonsillectomy    . Breast lumpectomy      "benign"  . Cataract extraction w/ intraocular  lens implant      "? side"   History reviewed. No pertinent family history. Social History  Substance Use Topics  . Smoking status: Never Smoker   . Smokeless tobacco: Never Used  . Alcohol Use: No   OB History    No data available     Review of Systems  Unable to perform ROS: Dementia      Allergies  Oxybutynin and Sulfa antibiotics  Home Medications   Prior to Admission medications   Medication Sig Start Date End Date Taking? Authorizing Provider  acetaminophen (TYLENOL) 325 MG tablet Take 650 mg by mouth daily as needed for moderate pain (pain).     Historical Provider, MD  atorvastatin (LIPITOR) 40 MG tablet Take 40 mg by mouth at bedtime.     Historical Provider, MD  cholecalciferol (VITAMIN D) 1000 UNITS tablet Take 1,000 Units by mouth daily.    Historical Provider, MD  docusate sodium (COLACE) 100 MG capsule Take 100 mg by mouth 2 (two) times daily.    Historical Provider, MD  donepezil (ARICEPT) 10 MG tablet Take 10 mg by mouth at bedtime.     Historical Provider, MD  feeding supplement, ENSURE COMPLETE, (ENSURE COMPLETE) LIQD Take 237 mLs by mouth 2 (two) times daily between meals. 12/07/14   Costin Karlyne Greenspan, MD  gabapentin (NEURONTIN) 300 MG capsule  Take 300 mg by mouth 2 (two) times daily as needed (pain).     Historical Provider, MD  hydrocortisone (PROCTOCORT) 1 % CREA Apply 1 application topically daily as needed (hemorrhoids).    Historical Provider, MD  loperamide (IMODIUM) 2 MG capsule Take 2 mg by mouth as needed for diarrhea or loose stools.    Historical Provider, MD  ondansetron (ZOFRAN) 4 MG tablet Take 4 mg by mouth every 6 (six) hours as needed for nausea.     Historical Provider, MD  polyethylene glycol (MIRALAX / GLYCOLAX) packet Take 17 g by mouth daily. Mix in 8 oz of liquid and drink    Historical Provider, MD  traMADol (ULTRAM) 50 MG tablet Take 1 tablet (50 mg total) by mouth 3 (three) times daily. 06/11/15   Ripudeep Krystal Eaton, MD  triamcinolone  (KENALOG) 0.025 % cream Apply 1 application topically 2 (two) times daily. Apply to back and abdominal area twice daily for rash    Historical Provider, MD   There were no vitals taken for this visit. Physical Exam  Constitutional: She is oriented to person, place, and time. She appears well-developed and well-nourished.  Patient is a thin, elderly female in no acute distress.  HENT:  There are staples in place to her scalp just above her hairline from a prior fall. Laceration is well approximated.  Eyes: EOM are normal. Pupils are equal, round, and reactive to light.  Neck: Normal range of motion. Neck supple.  Cardiovascular: Normal rate, regular rhythm and normal heart sounds.   No murmur heard. Pulmonary/Chest: Effort normal and breath sounds normal. No respiratory distress. She has no wheezes.  Abdominal: Soft. Bowel sounds are normal. She exhibits no distension. There is no tenderness.  Musculoskeletal: Normal range of motion. She exhibits no edema.  There is tenderness to palpation over the lateral left hip. There is no obvious deformity or extremity shortening. There is a surgical scar from a prior hip surgery noted to the left hip.  Neurological: She is alert and oriented to person, place, and time. No cranial nerve deficit. She exhibits normal muscle tone. Coordination normal.  Skin: Skin is warm and dry.  Nursing note and vitals reviewed.   ED Course  Procedures (including critical care time) Labs Review Labs Reviewed - No data to display  Imaging Review No results found. I have personally reviewed and evaluated these images and lab results as part of my medical decision-making.    MDM   Final diagnoses:  None    Patient is an 82 year old female with history of dementia brought for evaluation after a fall. She fell at the nursing home complaining of pain in her left elbow and left pelvis. X-rays reveal a nondisplaced olecranon fracture of the left elbow and a pelvic  fracture involving the superior and inferior rami. This was noted on an x-ray 2 days ago at [facility] regional she was also evaluated for a fall. CT scans of the head, cervical spine, and x-ray of the left femur are unremarkable. She will be discharged with follow-up with hand surgery.    Veryl Speak, MD 10/11/15 [phone]

## 2015-10-20 ENCOUNTER — Encounter (HOSPITAL_BASED_OUTPATIENT_CLINIC_OR_DEPARTMENT_OTHER): Payer: Self-pay

## 2015-10-20 ENCOUNTER — Emergency Department (HOSPITAL_BASED_OUTPATIENT_CLINIC_OR_DEPARTMENT_OTHER)
Admission: EM | Admit: 2015-10-20 | Discharge: 2015-10-21 | Disposition: A | Discharge status: Home / Self Care | Payer: Medicare Other | Attending: Emergency Medicine | Admitting: Emergency Medicine

## 2015-10-20 DIAGNOSIS — I10 Essential (primary) hypertension: Secondary | ICD-10-CM | POA: Insufficient documentation

## 2015-10-20 DIAGNOSIS — X58XXXS Exposure to other specified factors, sequela: Secondary | ICD-10-CM | POA: Diagnosis not present

## 2015-10-20 DIAGNOSIS — M1990 Unspecified osteoarthritis, unspecified site: Secondary | ICD-10-CM | POA: Diagnosis not present

## 2015-10-20 DIAGNOSIS — Z79899 Other long term (current) drug therapy: Secondary | ICD-10-CM | POA: Diagnosis not present

## 2015-10-20 DIAGNOSIS — S42402S Unspecified fracture of lower end of left humerus, sequela: Secondary | ICD-10-CM | POA: Insufficient documentation

## 2015-10-20 DIAGNOSIS — Z85528 Personal history of other malignant neoplasm of kidney: Secondary | ICD-10-CM | POA: Diagnosis not present

## 2015-10-20 DIAGNOSIS — E785 Hyperlipidemia, unspecified: Secondary | ICD-10-CM | POA: Diagnosis not present

## 2015-10-20 DIAGNOSIS — F0280 Dementia in other diseases classified elsewhere without behavioral disturbance: Secondary | ICD-10-CM | POA: Diagnosis not present

## 2015-10-20 DIAGNOSIS — Z8719 Personal history of other diseases of the digestive system: Secondary | ICD-10-CM | POA: Insufficient documentation

## 2015-10-20 DIAGNOSIS — G309 Alzheimer's disease, unspecified: Secondary | ICD-10-CM | POA: Diagnosis not present

## 2015-10-20 DIAGNOSIS — S52092S Other fracture of upper end of left ulna, sequela: Secondary | ICD-10-CM | POA: Diagnosis present

## 2015-10-20 NOTE — ED Provider Notes (Signed)
CSN: [id number]     Arrival date & time 10/20/15  1952 History   First MD Initiated Contact with Patient 10/20/15 2037     Chief Complaint  Patient presents with  . tore her cast off      (Consider location/radiation/quality/duration/timing/severity/associated sxs/prior Treatment) HPI  A LEVEL 5 CAVEAT PERTAINS DUE TO DEMENTIA Pt presents with a long arm cast on her left arm that has been mostly torn off.  Pt states she tore the cast off because it was bothering her.  Per chart review she has a proximal ulna fracture.    Past Medical History  Diagnosis Date  . Hypertension   . Hyperlipidemia   . Complication of anesthesia     "didn't wake up for a long time, pale, hypotension; got psychotic after one of her hip ORs in ~ 2010"  . Positive TB test     "father passed away from TB; she's negative/CXR"  . History of blood transfusion 2004    "related to hip replacement"  . Blood type, Rh negative   . Osteoarthritis     "severe in her hands" (12/04/2014)  . Alzheimer's dementia     "50% loss"  . Cancer of kidney ([place])   . Acute pancreatitis hospitalized 07/2014   Past Surgical History  Procedure Laterality Date  . Thyroid surgery  1960's    "took out tumor"  . Joint replacement    . Total hip arthroplasty Bilateral 2004-2010  . Abdominal hysterectomy    . Nephrectomy  ~ 2011    "cancer; it was contained"  . Tonsillectomy    . Breast lumpectomy      "benign"  . Cataract extraction w/ intraocular lens implant      "? side"   History reviewed. No pertinent family history. Social History  Substance Use Topics  . Smoking status: Never Smoker   . Smokeless tobacco: Never Used  . Alcohol Use: No   OB History    No data available     Review of Systems  UNABLE TO OBTAIN ROS DUE TO LEVEL 5 CAVEAT    Allergies  Oxybutynin and Sulfa antibiotics  Home Medications   Prior to Admission medications   Medication Sig Start Date End Date Taking? Authorizing Provider   acetaminophen (TYLENOL) 325 MG tablet Take 650 mg by mouth daily as needed for moderate pain (pain).     Historical Provider, MD  atorvastatin (LIPITOR) 40 MG tablet Take 40 mg by mouth at bedtime.     Historical Provider, MD  cholecalciferol (VITAMIN D) 1000 UNITS tablet Take 1,000 Units by mouth daily.    Historical Provider, MD  docusate sodium (COLACE) 100 MG capsule Take 100 mg by mouth 2 (two) times daily.    Historical Provider, MD  donepezil (ARICEPT) 10 MG tablet Take 10 mg by mouth at bedtime.     Historical Provider, MD  feeding supplement, ENSURE COMPLETE, (ENSURE COMPLETE) LIQD Take 237 mLs by mouth 2 (two) times daily between meals. 12/07/14   Costin Karlyne Greenspan, MD  gabapentin (NEURONTIN) 300 MG capsule Take 300 mg by mouth 2 (two) times daily as needed (pain).     Historical Provider, MD  HYDROcodone-acetaminophen (NORCO) 5-325 MG tablet Take 1 tablet by mouth every 6 (six) hours as needed. 10/11/15   Veryl Speak, MD  hydrocortisone (PROCTOCORT) 1 % CREA Apply 1 application topically daily as needed (hemorrhoids).    Historical Provider, MD  loperamide (IMODIUM) 2 MG capsule Take 2 mg by mouth  as needed for diarrhea or loose stools.    Historical Provider, MD  ondansetron (ZOFRAN) 4 MG tablet Take 4 mg by mouth every 6 (six) hours as needed for nausea.     Historical Provider, MD  polyethylene glycol (MIRALAX / GLYCOLAX) packet Take 17 g by mouth daily. Mix in 8 oz of liquid and drink    Historical Provider, MD  traMADol (ULTRAM) 50 MG tablet Take 1 tablet (50 mg total) by mouth 3 (three) times daily. 06/11/15   Ripudeep Krystal Eaton, MD  triamcinolone (KENALOG) 0.025 % cream Apply 1 application topically 2 (two) times daily. Apply to back and abdominal area twice daily for rash    Historical Provider, MD   BP 125/65 mmHg  Pulse 87  Temp(Src) 98.5 F (36.9 C) (Oral)  Resp 18  Ht 5' 1.5" (1.562 m)  Wt 45.36 kg  BMI 18.59 kg/m2  SpO2 100%  Vitals reviewed Physical Exam  Physical  Examination: General appearance - alert, well appearing, and in no distress Mental status - alert, oriented to person, place not to time Eyes - no conjunctival injection no scleral icterus Chest - clear to auscultation, no wheezes, rales or rhonchi, symmetric air entry Heart - normal rate, regular rhythm, normal S1, S2, no murmurs, rubs, clicks or gallops Neurological - alert, oriented, normal speech, no focal findings or movement disorder noted Musculoskeletal - left upper extremity with long arm splint partially torn apart, extremity still in 90 degree angle positioning, 2+ radial pulse, sensation intact in fingers, ttp over elbow, no deformity or swelling Extremities - peripheral pulses normal, no pedal edema, no clubbing or cyanosis Skin - normal coloration and turgor, no rashes  ED Course  Procedures (including critical care time) Labs Review Labs Reviewed - No data to display  Imaging Review No results found. I have personally reviewed and evaluated these images and lab results as part of my medical decision-making.   EKG Interpretation None      MDM   Final diagnoses:  Fractured elbow, left, sequela    Pt presenting with c/o removing her left upper arm cast- her arm/hand/fingers is NVI- long arm splint applied in the ED.  Discharged with strict return precautions.  Pt agreeable with plan.    Alfonzo Beers, MD 10/23/15 [phone]

## 2015-10-20 NOTE — ED Notes (Signed)
Waiting on PTAR, PT[facility] called

## 2015-10-20 NOTE — ED Notes (Signed)
Cast intact at this time

## 2015-10-20 NOTE — ED Notes (Signed)
[facility] called for transport.  

## 2015-10-20 NOTE — Discharge Instructions (Signed)
Return to the ED with any concerns including numbness/discoloration/swelling of hand or fingers, or any other alarming symptoms  She should followup with orthopedics for replacement of cast

## 2015-10-20 NOTE — ED Notes (Signed)
Per EMS pt from [facility], states pt had a cast placed to lt arm a few days ago, pt tore her cast off

## 2015-11-21 ENCOUNTER — Emergency Department (HOSPITAL_BASED_OUTPATIENT_CLINIC_OR_DEPARTMENT_OTHER): Payer: Medicare Other

## 2015-11-21 ENCOUNTER — Encounter (HOSPITAL_BASED_OUTPATIENT_CLINIC_OR_DEPARTMENT_OTHER): Payer: Self-pay | Admitting: Emergency Medicine

## 2015-11-21 ENCOUNTER — Emergency Department (HOSPITAL_BASED_OUTPATIENT_CLINIC_OR_DEPARTMENT_OTHER)
Admission: EM | Admit: 2015-11-21 | Discharge: 2015-11-21 | Disposition: A | Discharge status: Home / Self Care | Payer: Medicare Other | Attending: Emergency Medicine | Admitting: Emergency Medicine

## 2015-11-21 DIAGNOSIS — Y998 Other external cause status: Secondary | ICD-10-CM | POA: Insufficient documentation

## 2015-11-21 DIAGNOSIS — Z7952 Long term (current) use of systemic steroids: Secondary | ICD-10-CM | POA: Insufficient documentation

## 2015-11-21 DIAGNOSIS — F0280 Dementia in other diseases classified elsewhere without behavioral disturbance: Secondary | ICD-10-CM | POA: Insufficient documentation

## 2015-11-21 DIAGNOSIS — W01198A Fall on same level from slipping, tripping and stumbling with subsequent striking against other object, initial encounter: Secondary | ICD-10-CM | POA: Diagnosis not present

## 2015-11-21 DIAGNOSIS — Z85528 Personal history of other malignant neoplasm of kidney: Secondary | ICD-10-CM | POA: Diagnosis not present

## 2015-11-21 DIAGNOSIS — I10 Essential (primary) hypertension: Secondary | ICD-10-CM | POA: Diagnosis not present

## 2015-11-21 DIAGNOSIS — Z8719 Personal history of other diseases of the digestive system: Secondary | ICD-10-CM | POA: Insufficient documentation

## 2015-11-21 DIAGNOSIS — W19XXXA Unspecified fall, initial encounter: Secondary | ICD-10-CM

## 2015-11-21 DIAGNOSIS — M1990 Unspecified osteoarthritis, unspecified site: Secondary | ICD-10-CM | POA: Insufficient documentation

## 2015-11-21 DIAGNOSIS — Y92129 Unspecified place in nursing home as the place of occurrence of the external cause: Secondary | ICD-10-CM | POA: Diagnosis not present

## 2015-11-21 DIAGNOSIS — S0990XA Unspecified injury of head, initial encounter: Secondary | ICD-10-CM | POA: Diagnosis present

## 2015-11-21 DIAGNOSIS — S0003XA Contusion of scalp, initial encounter: Secondary | ICD-10-CM | POA: Diagnosis not present

## 2015-11-21 DIAGNOSIS — G309 Alzheimer's disease, unspecified: Secondary | ICD-10-CM | POA: Insufficient documentation

## 2015-11-21 DIAGNOSIS — Y9389 Activity, other specified: Secondary | ICD-10-CM | POA: Insufficient documentation

## 2015-11-21 DIAGNOSIS — Z79899 Other long term (current) drug therapy: Secondary | ICD-10-CM | POA: Insufficient documentation

## 2015-11-21 DIAGNOSIS — E785 Hyperlipidemia, unspecified: Secondary | ICD-10-CM | POA: Insufficient documentation

## 2015-11-21 NOTE — ED Notes (Signed)
Report given to Vaughan Basta at [facility] at this time. Secretary calling [facility] for transportation at this time.

## 2015-11-21 NOTE — ED Notes (Signed)
[facility] has been called to transport the patient to [facility]

## 2015-11-21 NOTE — ED Provider Notes (Signed)
CSN: [id number]     Arrival date & time 11/21/15  1747 History  By signing my name below, I, Arianna Nassar, attest that this documentation has been prepared under the direction and in the presence of Merrily Pew, MD. Electronically Signed: Julien Nordmann, ED Scribe. 11/21/2015. 6:52 PM.      Chief Complaint  Patient presents with  . Fall      The history is provided by the nursing home and the patient. No language interpreter was used.   HPI Comments: Kaitlin Rodriguez is a 82 y.o. female with a hx of HTN, hyperlipidemia, dementia, and osteoarthritis was brought in by EMS presents to the Emergency Department complaining of a fall that occurred this afternoon. Pt has a large hematoma to the back of her head and complains of head pain. According to her nursing facility at [facility], pt tripped and fell backward hitting her head. There was no LOC. She denies neck pain, back pain, chest pain, abdominal pain, hip pain, leg pain, and foot pain.    Past Medical History  Diagnosis Date  . Hypertension   . Hyperlipidemia   . Complication of anesthesia     "didn't wake up for a long time, pale, hypotension; got psychotic after one of her hip ORs in ~ 2010"  . Positive TB test     "father passed away from TB; she's negative/CXR"  . History of blood transfusion 2004    "related to hip replacement"  . Blood type, Rh negative   . Osteoarthritis     "severe in her hands" (12/04/2014)  . Alzheimer's dementia     "50% loss"  . Cancer of kidney ([place])   . Acute pancreatitis hospitalized 07/2014   Past Surgical History  Procedure Laterality Date  . Thyroid surgery  1960's    "took out tumor"  . Joint replacement    . Total hip arthroplasty Bilateral 2004-2010  . Abdominal hysterectomy    . Nephrectomy  ~ 2011    "cancer; it was contained"  . Tonsillectomy    . Breast lumpectomy      "benign"  . Cataract extraction w/ intraocular lens implant      "? side"   No family history on  file. Social History  Substance Use Topics  . Smoking status: Never Smoker   . Smokeless tobacco: Never Used  . Alcohol Use: No   OB History    No data available     Review of Systems  Cardiovascular: Negative for chest pain.  Gastrointestinal: Negative for abdominal pain.  Musculoskeletal: Negative for back pain, arthralgias and neck pain.  Neurological: Positive for headaches.  All other systems reviewed and are negative.     Allergies  Oxybutynin and Sulfa antibiotics  Home Medications   Prior to Admission medications   Medication Sig Start Date End Date Taking? Authorizing Provider  acetaminophen (TYLENOL) 325 MG tablet Take 650 mg by mouth daily as needed for moderate pain (pain).     Historical Provider, MD  atorvastatin (LIPITOR) 40 MG tablet Take 40 mg by mouth at bedtime.     Historical Provider, MD  cholecalciferol (VITAMIN D) 1000 UNITS tablet Take 1,000 Units by mouth daily.    Historical Provider, MD  docusate sodium (COLACE) 100 MG capsule Take 100 mg by mouth 2 (two) times daily.    Historical Provider, MD  donepezil (ARICEPT) 10 MG tablet Take 10 mg by mouth at bedtime.     Historical Provider, MD  feeding supplement,  ENSURE COMPLETE, (ENSURE COMPLETE) LIQD Take 237 mLs by mouth 2 (two) times daily between meals. 12/07/14   Costin Karlyne Greenspan, MD  gabapentin (NEURONTIN) 300 MG capsule Take 300 mg by mouth 2 (two) times daily as needed (pain).     Historical Provider, MD  HYDROcodone-acetaminophen (NORCO) 5-325 MG tablet Take 1 tablet by mouth every 6 (six) hours as needed. 10/11/15   Veryl Speak, MD  hydrocortisone (PROCTOCORT) 1 % CREA Apply 1 application topically daily as needed (hemorrhoids).    Historical Provider, MD  loperamide (IMODIUM) 2 MG capsule Take 2 mg by mouth as needed for diarrhea or loose stools.    Historical Provider, MD  ondansetron (ZOFRAN) 4 MG tablet Take 4 mg by mouth every 6 (six) hours as needed for nausea.     Historical Provider, MD   polyethylene glycol (MIRALAX / GLYCOLAX) packet Take 17 g by mouth daily. Mix in 8 oz of liquid and drink    Historical Provider, MD  traMADol (ULTRAM) 50 MG tablet Take 1 tablet (50 mg total) by mouth 3 (three) times daily. 06/11/15   Ripudeep Krystal Eaton, MD  triamcinolone (KENALOG) 0.025 % cream Apply 1 application topically 2 (two) times daily. Apply to back and abdominal area twice daily for rash    Historical Provider, MD   Triage vitals: BP 93/76 mmHg  Pulse 72  Temp(Src) 98.1 F (36.7 C) (Oral)  Resp 22  Wt 100 lb (45.36 kg)  SpO2 98% Physical Exam  Constitutional: She is oriented to person, place, and time. She appears well-developed and well-nourished.  HENT:  Head: Normocephalic.  Eyes: EOM are normal.  Neck: Normal range of motion.  Pulmonary/Chest: Effort normal.  Abdominal: She exhibits no distension.  Musculoskeletal: Normal range of motion.  3 cm hematoma to posterior scalp, splint to left arm, no tenderness elsewhere  Neurological: She is alert and oriented to person, place, and time.  cranial nerves intact, distal motors intact   Psychiatric: She has a normal mood and affect.  Nursing note and vitals reviewed.   ED Course  Procedures  DIAGNOSTIC STUDIES: Oxygen Saturation is 98% on RA, normal by my interpretation.  COORDINATION OF CARE:  6:11 PM Discussed treatment plan which includes CT of head with pt at bedside and pt agreed to plan.  Labs Review Labs Reviewed - No data to display  Imaging Review Ct Head Wo Contrast  11/21/2015  CLINICAL DATA:  Trip and fall this afternoon striking back of head. Posterior hematoma. Head pain. No loss of consciousness. EXAM: CT HEAD WITHOUT CONTRAST TECHNIQUE: Contiguous axial images were obtained from the base of the skull through the vertex without intravenous contrast. COMPARISON:  Head CT 10/11/2015, multiple priors FINDINGS: Left parietal subgaleal scalp hematoma without subjacent fracture. No intracranial hemorrhage, mass  effect, or midline shift. No hydrocephalus. The basilar cisterns are patent. Atrophy and chronic small vessel ischemia with encephalomalacia in the left occipital lobe, unchanged. No evidence of territorial infarct. No intracranial fluid collection. Calvarium is intact. Scattered mucosal thickening of the right ethmoid air cells and right maxillary sinus. The mastoid air cells are well aerated. IMPRESSION: 1. Left parietal scalp hematoma without subjacent fracture or acute intracranial abnormality. 2. Stable atrophy and chronic small vessel ischemia. 3. Mild right paranasal sinus disease. Electronically Signed   By: Jeb Levering M.D.   On: 11/21/2015 18:45   I have personally reviewed and evaluated these images and lab results as part of my medical decision-making.   EKG Interpretation None  MDM   Final diagnoses:  Fall, initial encounter  Head injury, initial encounter    Possibly unwitnessed fall but patient states she didn't pass out. Only hurts where she has a hematoma. Ct negative for fracture. Has a history of multiple falls. May need higher level of care.   I personally performed the services described in this documentation, which was scribed in my presence. The recorded information has been reviewed and is accurate.     Merrily Pew, MD 11/21/15 [phone]

## 2015-11-21 NOTE — ED Notes (Signed)
Pt unwitnessed fall at nursing facility. States she seemed to have tripped and fallen backwards hitting head. Large hematoma to back of head. Facility denies LOC. Pt and EMS denies other pain or discomfort.

## 2015-11-24 ENCOUNTER — Emergency Department (HOSPITAL_BASED_OUTPATIENT_CLINIC_OR_DEPARTMENT_OTHER)
Admission: EM | Admit: 2015-11-24 | Discharge: 2015-11-24 | Disposition: A | Discharge status: Home / Self Care | Payer: Medicare Other | Attending: Emergency Medicine | Admitting: Emergency Medicine

## 2015-11-24 ENCOUNTER — Encounter (HOSPITAL_BASED_OUTPATIENT_CLINIC_OR_DEPARTMENT_OTHER): Payer: Self-pay | Admitting: *Deleted

## 2015-11-24 DIAGNOSIS — Y9289 Other specified places as the place of occurrence of the external cause: Secondary | ICD-10-CM | POA: Insufficient documentation

## 2015-11-24 DIAGNOSIS — F0280 Dementia in other diseases classified elsewhere without behavioral disturbance: Secondary | ICD-10-CM | POA: Insufficient documentation

## 2015-11-24 DIAGNOSIS — E785 Hyperlipidemia, unspecified: Secondary | ICD-10-CM | POA: Diagnosis not present

## 2015-11-24 DIAGNOSIS — Z79899 Other long term (current) drug therapy: Secondary | ICD-10-CM | POA: Insufficient documentation

## 2015-11-24 DIAGNOSIS — M1990 Unspecified osteoarthritis, unspecified site: Secondary | ICD-10-CM | POA: Diagnosis not present

## 2015-11-24 DIAGNOSIS — W1839XA Other fall on same level, initial encounter: Secondary | ICD-10-CM | POA: Diagnosis not present

## 2015-11-24 DIAGNOSIS — Y9389 Activity, other specified: Secondary | ICD-10-CM | POA: Diagnosis not present

## 2015-11-24 DIAGNOSIS — Y998 Other external cause status: Secondary | ICD-10-CM | POA: Insufficient documentation

## 2015-11-24 DIAGNOSIS — Z7952 Long term (current) use of systemic steroids: Secondary | ICD-10-CM | POA: Insufficient documentation

## 2015-11-24 DIAGNOSIS — I10 Essential (primary) hypertension: Secondary | ICD-10-CM | POA: Insufficient documentation

## 2015-11-24 DIAGNOSIS — Z85528 Personal history of other malignant neoplasm of kidney: Secondary | ICD-10-CM | POA: Diagnosis not present

## 2015-11-24 DIAGNOSIS — Z043 Encounter for examination and observation following other accident: Secondary | ICD-10-CM | POA: Insufficient documentation

## 2015-11-24 DIAGNOSIS — G309 Alzheimer's disease, unspecified: Secondary | ICD-10-CM | POA: Diagnosis not present

## 2015-11-24 NOTE — ED Notes (Signed)
[facility] here for pickup 

## 2015-11-24 NOTE — ED Notes (Addendum)
Pt brought in by EMS from [facility] for unwitnessed fall, staff states found pt on floor with blanket and her pillow, pt denies complaints

## 2015-11-24 NOTE — Discharge Instructions (Signed)
Return to the emergency department if you experience any new symptoms which you feel require medical evaluation.

## 2015-11-24 NOTE — ED Notes (Signed)
[facility]AR has been called for transport back to [facility].

## 2015-11-24 NOTE — ED Notes (Signed)
Report given to [facility] at [place] called for  transport

## 2015-11-24 NOTE — ED Provider Notes (Signed)
CSN: [id number]     Arrival date & time 11/24/15  1741 History  By signing my name below, I, Kaitlin Rodriguez, attest that this documentation has been prepared under the direction and in the presence of Veryl Speak, MD. Electronically Signed: Altamease Rodriguez, ED Scribe. 11/24/2015. 6:13 PM   Chief Complaint  Patient presents with  . Fall   Level V caveat due to dementia   The history is provided by the patient and the EMS personnel. No language interpreter was used.   Brought in by EMS, Kaitlin Rodriguez is a 82 y.o. female with history of Alzheimer's dementia  who presents to the Emergency Department for evaluation after being found on the floor at her nursing facility with a blanket and pillow. It is unclear if the pt fell and the pt does not remember how she ended up on the floor.  Associated symptoms include left hip pain. She denies headache, neck pain, chest pain, SOB, and abdominal pain.    Past Medical History  Diagnosis Date  . Hypertension   . Hyperlipidemia   . Complication of anesthesia     "didn't wake up for a long time, pale, hypotension; got psychotic after one of her hip ORs in ~ 2010"  . Positive TB test     "father passed away from TB; she's negative/CXR"  . History of blood transfusion 2004    "related to hip replacement"  . Blood type, Rh negative   . Osteoarthritis     "severe in her hands" (12/04/2014)  . Alzheimer's dementia     "50% loss"  . Cancer of kidney ([place])   . Acute pancreatitis hospitalized 07/2014   Past Surgical History  Procedure Laterality Date  . Thyroid surgery  1960's    "took out tumor"  . Joint replacement    . Total hip arthroplasty Bilateral 2004-2010  . Abdominal hysterectomy    . Nephrectomy  ~ 2011    "cancer; it was contained"  . Tonsillectomy    . Breast lumpectomy      "benign"  . Cataract extraction w/ intraocular lens implant      "? side"   History reviewed. No pertinent family history. Social History  Substance Use  Topics  . Smoking status: Never Smoker   . Smokeless tobacco: Never Used  . Alcohol Use: No   OB History    No data available     Review of Systems  Unable to perform ROS: Dementia    Allergies  Oxybutynin and Sulfa antibiotics  Home Medications   Prior to Admission medications   Medication Sig Start Date End Date Taking? Authorizing Provider  atorvastatin (LIPITOR) 40 MG tablet Take 40 mg by mouth at bedtime.     Historical Provider, MD  cholecalciferol (VITAMIN D) 1000 UNITS tablet Take 1,000 Units by mouth daily.    Historical Provider, MD  docusate sodium (COLACE) 100 MG capsule Take 100 mg by mouth 2 (two) times daily.    Historical Provider, MD  donepezil (ARICEPT) 10 MG tablet Take 10 mg by mouth at bedtime.     Historical Provider, MD  feeding supplement, ENSURE COMPLETE, (ENSURE COMPLETE) LIQD Take 237 mLs by mouth 2 (two) times daily between meals. 12/07/14   Costin Karlyne Greenspan, MD  HYDROcodone-acetaminophen (NORCO) 5-325 MG tablet Take 1 tablet by mouth every 6 (six) hours as needed. 10/11/15   Veryl Speak, MD  polyethylene glycol (MIRALAX / GLYCOLAX) packet Take 17 g by mouth daily. Mix in 8  oz of liquid and drink    Historical Provider, MD  triamcinolone (KENALOG) 0.025 % cream Apply 1 application topically 2 (two) times daily. Apply to back and abdominal area twice daily for rash    Historical Provider, MD   BP 92/66 mmHg  Pulse 77  Temp(Src) 98.7 F (37.1 C) (Oral)  Resp 18  SpO2 100% Physical Exam  Constitutional: She is oriented to person, place, and time. She appears well-developed and well-nourished. No distress.  HENT:  Head: Normocephalic and atraumatic.  Eyes: EOM are normal.  Neck: Normal range of motion.  No tenderness. Painless ROM in all directions.   Cardiovascular: Normal rate, regular rhythm and normal heart sounds.   Pulmonary/Chest: Effort normal and breath sounds normal.  Abdominal: Soft. She exhibits no distension. There is no tenderness.   Musculoskeletal: Normal range of motion.  No hip deformity or rotation of either leg.  FROM of both hips.   Neurological: She is alert and oriented to person, place, and time.  Skin: Skin is warm and dry.  Psychiatric: She has a normal mood and affect. Judgment normal.  Nursing note and vitals reviewed.   ED Course  Procedures (including critical care time) DIAGNOSTIC STUDIES: Oxygen Saturation is 100% on RA,  normal by my interpretation.    COORDINATION OF CARE: 6:01 PM Discussed treatment plan with pt at bedside and pt agreed to plan.  Labs Review Labs Reviewed - No data to display  Imaging Review No results found.     MDM   Final diagnoses:  None    Patient brought to the emergency department after being found on the floor of the extended care facility lying under a blanket. They're uncertain as to how she got there, whether she had fallen or lay down on the floor on her own. The patient has a history of dementia and adds little additional history. She appears to be uninjured. I see no sign of trauma. She has good range of motion of all extremities and her cervical spine has been cleared clinically. Neurologically she has at her baseline and I feel as though no workup is indicated. She will be returned to her extended care facility, to return as needed for any problems  I personally performed the services described in this documentation, which was scribed in my presence. The recorded information has been reviewed and is accurate.       Veryl Speak, MD 11/25/15 [phone]

## 2016-09-11 IMAGING — CT CT HEAD W/O CM
1 of 2 series · 15 of 30 positions shown, 19 images · non-contrast
Comparison: Head CT 10/11/2015, multiple priors

CLINICAL DATA: Trip and fall this afternoon striking back of head.
Posterior hematoma. Head pain. No loss of consciousness.

EXAM:
CT HEAD WITHOUT CONTRAST
TECHNIQUE: Contiguous axial images were obtained from the base of the skull
through the vertex without intravenous contrast.

[Series 6: head wo · axial · 0.43mm/px · z∈[-187,-52]mm · 15 of 33 slices shown, 19 images]
[im 2/33  brain]
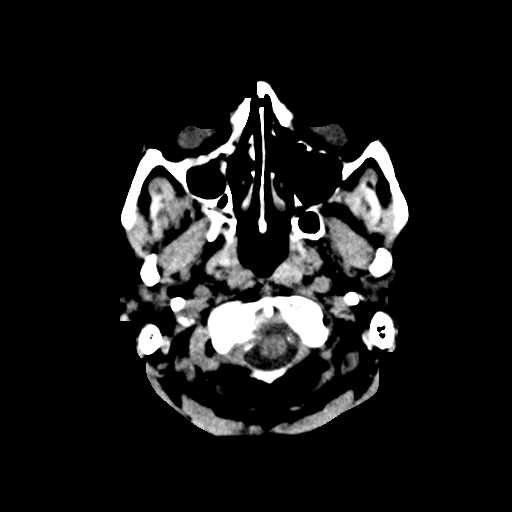
[im 2/33  bone]
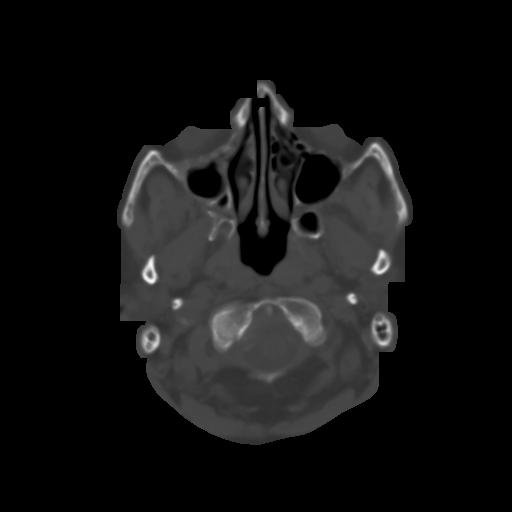
[im 5/33  brain]
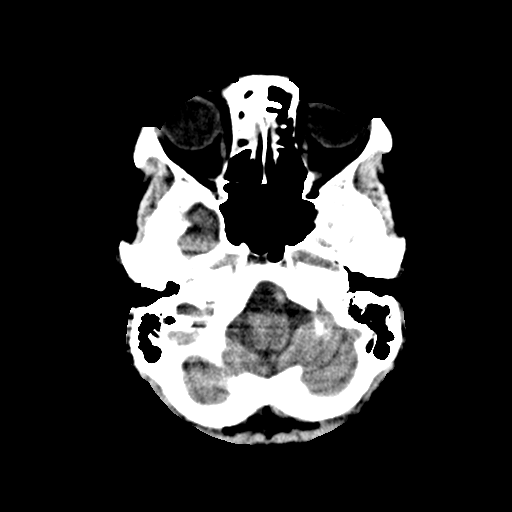
[im 7/33  brain]
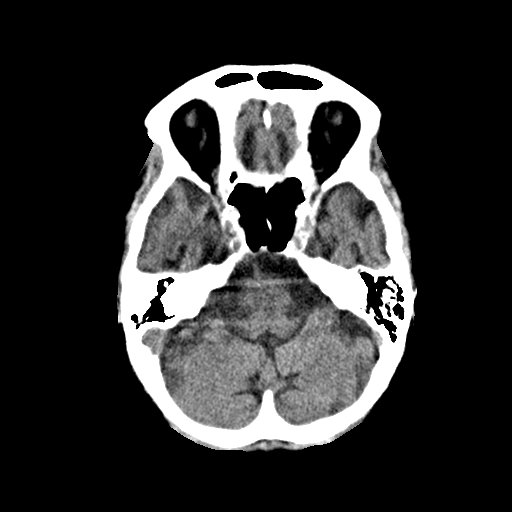
[im 8/33  brain]
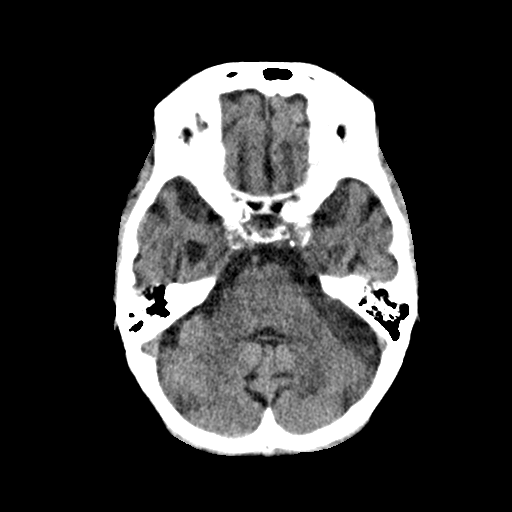
[im 11/33  brain]
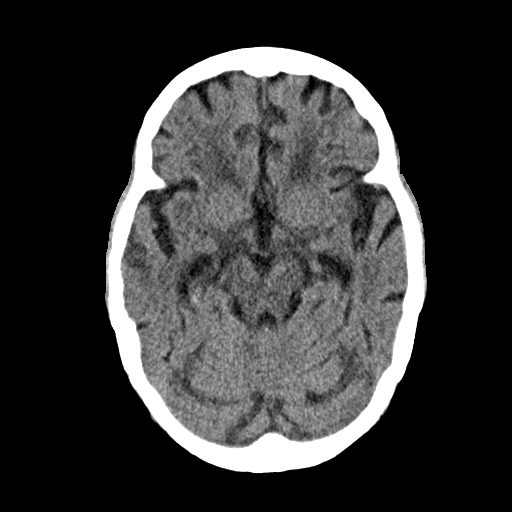
[im 11/33  bone]
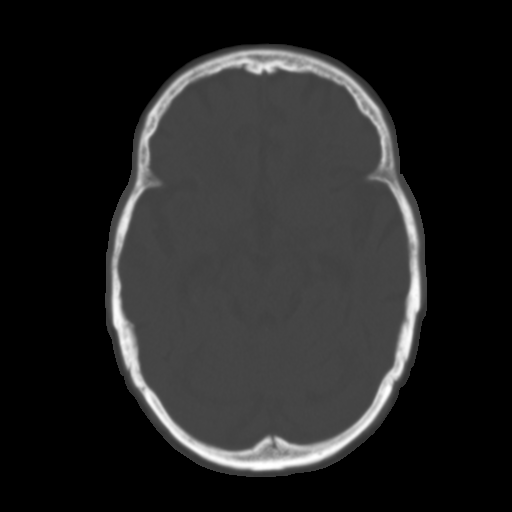
[im 13/33  brain]
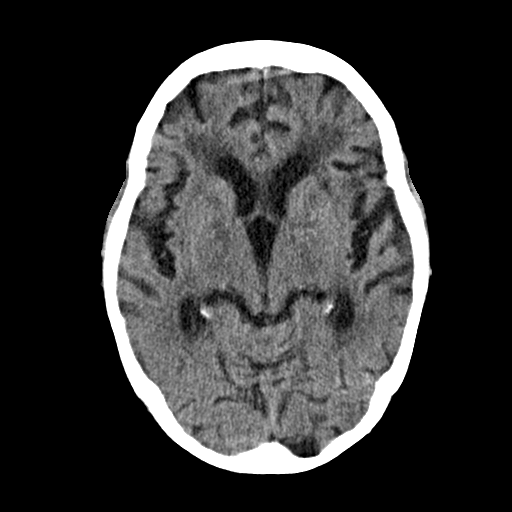
[im 14/33  brain]
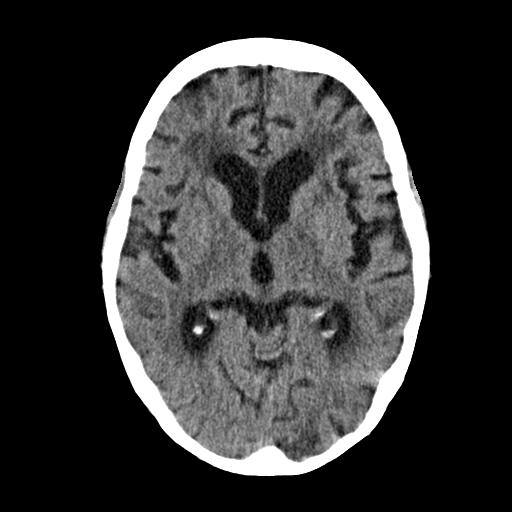
[im 17/33  brain]
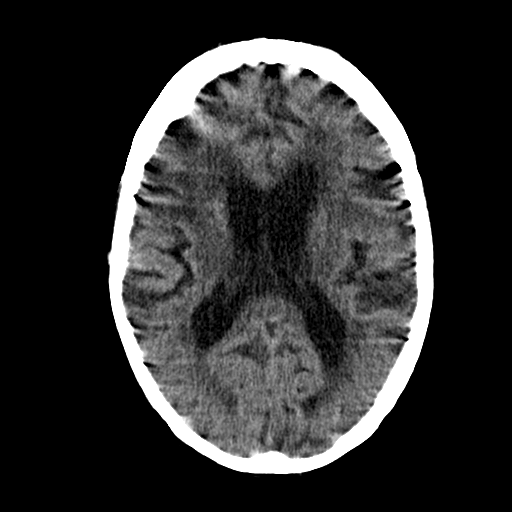
[im 19/33  brain]
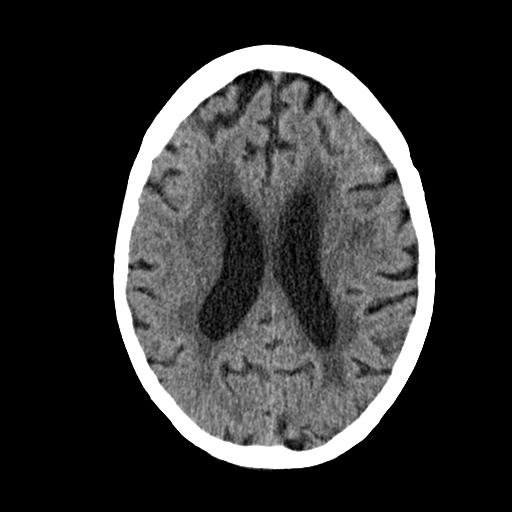
[im 19/33  bone]
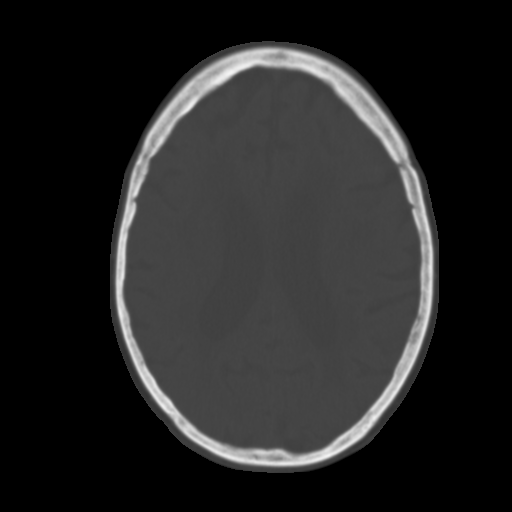
[im 20/33  brain]
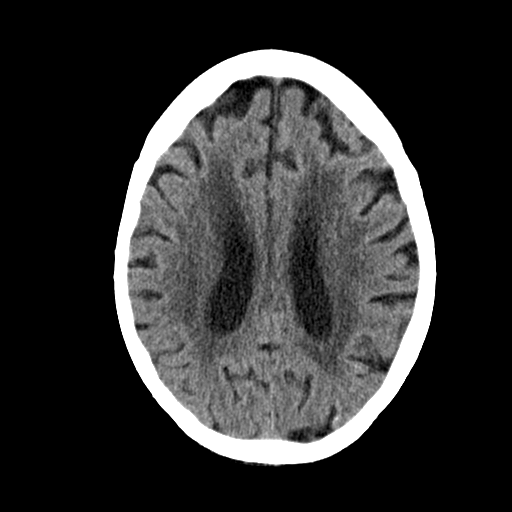
[im 23/33  brain]
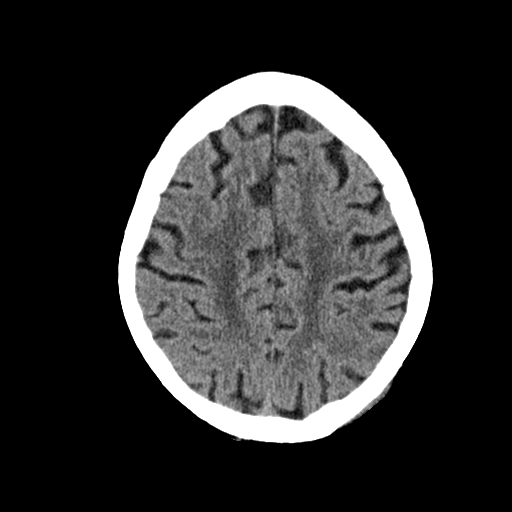
[im 25/33  brain]
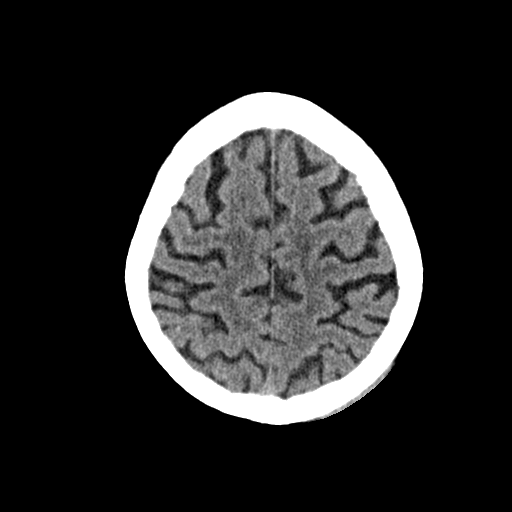
[im 26/33  brain]
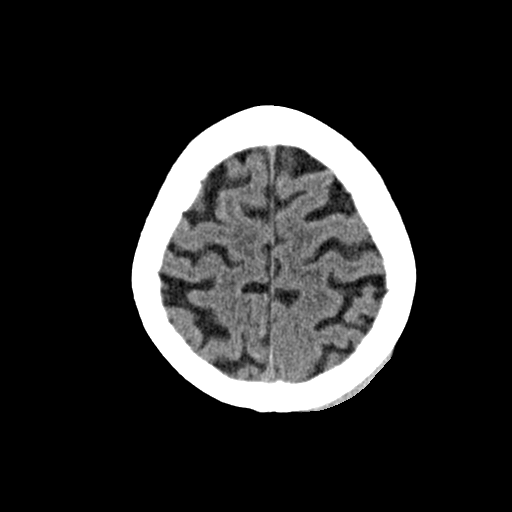
[im 26/33  bone]
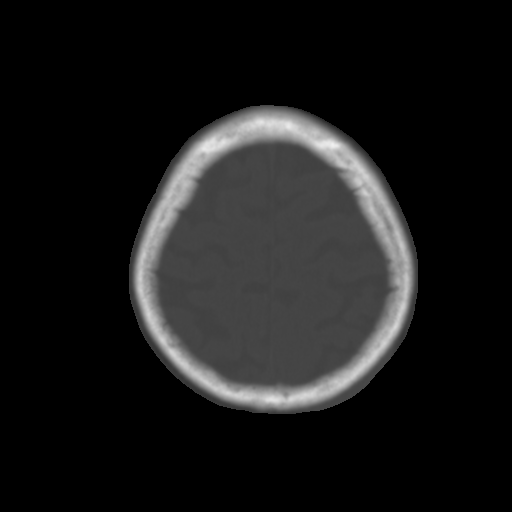
[im 29/33  brain]
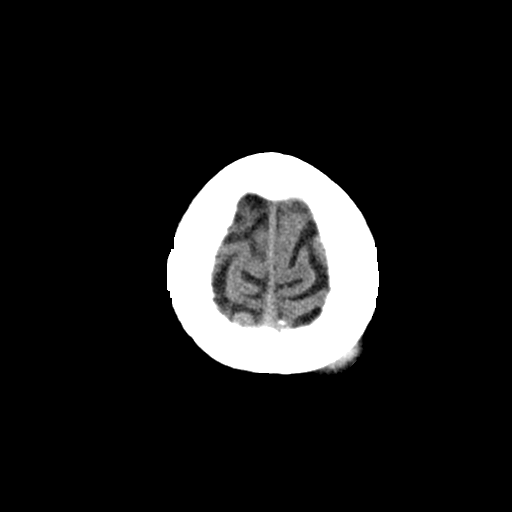
[im 31/33  brain]
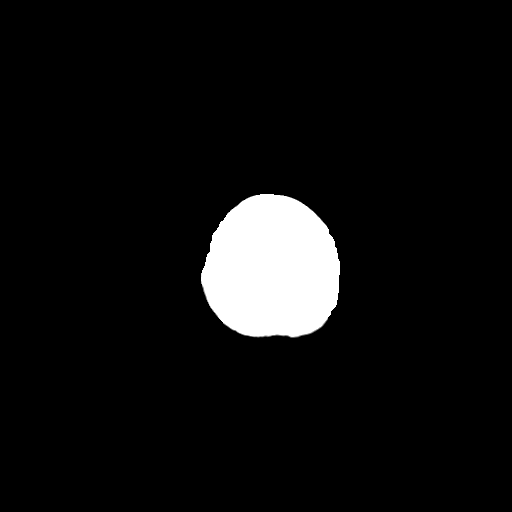

[15 of 30 positions shown; findings below may reference images not displayed]

FINDINGS: Left parietal subgaleal scalp hematoma without subjacent fracture.
No intracranial hemorrhage, mass effect, or midline shift. No
hydrocephalus. The basilar cisterns are patent. Atrophy and chronic
small vessel ischemia with encephalomalacia in the left occipital
lobe, unchanged. No evidence of territorial infarct. No intracranial
fluid collection. Calvarium is intact. Scattered mucosal thickening
of the right ethmoid air cells and right maxillary sinus. The
mastoid air cells are well aerated.
IMPRESSION: 1. Left parietal scalp hematoma without subjacent fracture or acute
intracranial abnormality.
2. Stable atrophy and chronic small vessel ischemia.
3. Mild right paranasal sinus disease.
